# Patient Record
Sex: Male | Born: 1965 | Race: Black or African American | Hispanic: No | Marital: Single | Smoking: Current every day smoker
Health system: Southern US, Community
[De-identification: ages and names within clinical notes are randomized; demographics above are authoritative.]

## PROBLEM LIST (undated history)

## (undated) DIAGNOSIS — Z0000 Encounter for general adult medical examination without abnormal findings: Secondary | ICD-10-CM

---

## 2017-03-21 ENCOUNTER — Encounter (HOSPITAL_BASED_OUTPATIENT_CLINIC_OR_DEPARTMENT_OTHER): Payer: Self-pay

## 2017-03-21 ENCOUNTER — Emergency Department (HOSPITAL_BASED_OUTPATIENT_CLINIC_OR_DEPARTMENT_OTHER): Payer: BLUE CROSS/BLUE SHIELD

## 2017-03-21 ENCOUNTER — Emergency Department (HOSPITAL_BASED_OUTPATIENT_CLINIC_OR_DEPARTMENT_OTHER)
Admission: EM | Admit: 2017-03-21 | Discharge: 2017-03-21 | Disposition: A | Discharge status: Home / Self Care | Payer: BLUE CROSS/BLUE SHIELD | Attending: Emergency Medicine | Admitting: Emergency Medicine

## 2017-03-21 ENCOUNTER — Other Ambulatory Visit: Payer: Self-pay

## 2017-03-21 DIAGNOSIS — R0602 Shortness of breath: Secondary | ICD-10-CM | POA: Diagnosis present

## 2017-03-21 DIAGNOSIS — F17210 Nicotine dependence, cigarettes, uncomplicated: Secondary | ICD-10-CM | POA: Insufficient documentation

## 2017-03-21 DIAGNOSIS — J189 Pneumonia, unspecified organism: Secondary | ICD-10-CM | POA: Diagnosis not present

## 2017-03-21 MED ORDER — IBUPROFEN 800 MG PO TABS
800.0000 mg | ORAL_TABLET | Freq: Once | ORAL | Status: AC
  Administered 2017-03-21: 800 mg via ORAL
  Filled 2017-03-21: qty 1

## 2017-03-21 MED ORDER — AZITHROMYCIN 250 MG PO TABS: ORAL_TABLET | ORAL | 0 refills | Status: DC

## 2017-03-21 MED ORDER — AZITHROMYCIN 250 MG PO TABS
500.0000 mg | ORAL_TABLET | Freq: Once | ORAL | Status: AC
  Administered 2017-03-21: 500 mg via ORAL
  Filled 2017-03-21: qty 2

## 2017-03-21 MED ORDER — AMOXICILLIN-POT CLAVULANATE 875-125 MG PO TABS: 1.0000 | ORAL_TABLET | Freq: Two times a day (BID) | ORAL | 0 refills | Status: DC

## 2017-03-21 MED ORDER — NAPROXEN 500 MG PO TABS: 500.0000 mg | ORAL_TABLET | Freq: Two times a day (BID) | ORAL | 0 refills | Status: DC

## 2017-03-21 MED ORDER — CEFTRIAXONE SODIUM 1 G IJ SOLR
1.0000 g | Freq: Once | INTRAMUSCULAR | Status: AC
  Administered 2017-03-21: 1 g via INTRAMUSCULAR
  Filled 2017-03-21: qty 10

## 2017-03-21 NOTE — ED Provider Notes (Signed)
[facility] EMERGENCY DEPARTMENT Provider Note   CSN: [id number] Arrival date & time: 03/21/17  1825     History   Chief Complaint Chief Complaint  Patient presents with  . Shortness of Breath    HPI Erik [name] is a 51 y.o. male. Chief complaint is fevers 4 days ago, cough.  HPI 51 year old male. Otherwise healthy. States that 4 days ago on Sunday he had shakes and chills. The next day on Monday he had a cough. Now 3 days ago on Thursday has persistent cough and some right-sided pleuritic chest pain. No or fevers. No shortness of breath. States he feels somewhat weak and fatigued. He continues to work without restrictions or difficulties. No GI complaints.  History reviewed. No pertinent past medical history.  There are no active problems to display for this patient.   History reviewed. No pertinent surgical history.     Home Medications    Prior to Admission medications   Medication Sig Start Date End Date Taking? Authorizing Provider  amoxicillin-clavulanate (AUGMENTIN) 875-125 MG tablet Take 1 tablet by mouth 2 (two) times daily. 03/21/17   Rolland PorterJames, Simpson Paulos, MD  azithromycin (ZITHROMAX Z-PAK) 250 MG tablet As directed 03/21/17   Rolland PorterJames, Tanesia Butner, MD  naproxen (NAPROSYN) 500 MG tablet Take 1 tablet (500 mg total) by mouth 2 (two) times daily. 03/21/17   Rolland PorterJames, Cian Costanzo, MD    Family History No family history on file.  Social History Social History   Tobacco Use  . Smoking status: Current Every Day Smoker  . Smokeless tobacco: Never Used  Substance Use Topics  . Alcohol use: Yes    Comment: weekly  . Drug use: No     Allergies   Patient has no known allergies.   Review of Systems Review of Systems  Constitutional: Positive for chills and fever. Negative for appetite change, diaphoresis and fatigue.  HENT: Negative for mouth sores, sore throat and trouble swallowing.   Eyes: Negative for visual disturbance.  Respiratory: Positive for cough. Negative for  chest tightness, shortness of breath and wheezing.   Cardiovascular: Negative for chest pain.  Gastrointestinal: Negative for abdominal distention, abdominal pain, diarrhea, nausea and vomiting.  Endocrine: Negative for polydipsia, polyphagia and polyuria.  Genitourinary: Negative for dysuria, frequency and hematuria.  Musculoskeletal: Negative for gait problem.  Skin: Negative for color change, pallor and rash.  Neurological: Negative for dizziness, syncope, light-headedness and headaches.  Hematological: Does not bruise/bleed easily.  Psychiatric/Behavioral: Negative for behavioral problems and confusion.     Physical Exam Updated Vital Signs BP 128/84 (BP Location: Right Arm)   Pulse 96   Temp 99.9 F (37.7 C) (Oral)   Resp 20   Ht 5\' 6"  (1.676 m)   Wt 73 kg (161 lb)   SpO2 97%   BMI 25.99 kg/m   Physical Exam  Constitutional: He is oriented to person, place, and time. He appears well-developed and well-nourished. No distress.  HENT:  Head: Normocephalic.  Eyes: Conjunctivae are normal. Pupils are equal, round, and reactive to light. No scleral icterus.  Neck: Normal range of motion. Neck supple. No thyromegaly present.  Cardiovascular: Normal rate and regular rhythm. Exam reveals no gallop and no friction rub.  No murmur heard. Pulmonary/Chest: Effort normal and breath sounds normal. No respiratory distress. He has no wheezes. He has no rales.  Crackles right lateral inferior chest. No increased work of breathing. Not tachypneic. Saturating 100%. Temp 99.  Abdominal: Soft. Bowel sounds are normal. He exhibits no distension. There  is no tenderness. There is no rebound.  Musculoskeletal: Normal range of motion.  Neurological: He is alert and oriented to person, place, and time.  Skin: Skin is warm and dry. No rash noted.  Psychiatric: He has a normal mood and affect. His behavior is normal.     ED Treatments / Results  Labs (all labs ordered are listed, but only  abnormal results are displayed) Labs Reviewed - No data to display  EKG  EKG Interpretation None       Radiology Dg Chest 2 View  Result Date: 03/21/2017 CLINICAL DATA:  Cough and shortness of breath EXAM: CHEST  2 VIEW COMPARISON:  None. FINDINGS: There is patchy airspace consolidation in both lung bases, more on the right than on the left. The lungs elsewhere clear. Heart size and pulmonary vascularity are normal. No adenopathy. No evident bone lesions. IMPRESSION: Patchy airspace consolidation in the lung bases, more on the right on the left, felt to represent multifocal pneumonia. Lungs elsewhere clear. Heart size within normal limits. No evident adenopathy. Electronically Signed   By: Bretta BangWilliam  Woodruff III M.D.   On: 03/21/2017 18:59    Procedures Procedures (including critical care time)  Medications Ordered in ED Medications  cefTRIAXone (ROCEPHIN) injection 1 g (1 g Intramuscular Given 03/21/17 1922)  azithromycin (ZITHROMAX) tablet 500 mg (500 mg Oral Given 03/21/17 1920)  ibuprofen (ADVIL,MOTRIN) tablet 800 mg (800 mg Oral Given 03/21/17 1920)     Initial Impression / Assessment and Plan / ED Course  I have reviewed the triage vital signs and the nursing notes.  Pertinent labs & imaging results that were available during my care of the patient were reviewed by me and considered in my medical decision making (see chart for details).     Chest x-ray shows bilateral lower lobe infiltrates. Patchy. Not frankly lobar. With his single Reiger 4 days ago followed by cough suspicion for strep pneumoniae. Bilateral without consider atypical. We'll treat with IM Rocephin tonight. Augmentin and Zithromax. He declines any additional treatment or therapy. He is well-hydrated taking by mouth. Given ibuprofen for his pleuritic discomfort. Recheck with worsening symptoms.  Final Clinical Impressions(s) / ED Diagnoses   Final diagnoses:  Community acquired pneumonia, unspecified  laterality    ED Discharge Orders        Ordered    amoxicillin-clavulanate (AUGMENTIN) 875-125 MG tablet  2 times daily     03/21/17 1925    azithromycin (ZITHROMAX Z-PAK) 250 MG tablet     03/21/17 1925    naproxen (NAPROSYN) 500 MG tablet  2 times daily     03/21/17 1925       Rolland PorterJames, Mayetta Castleman, MD 03/21/17 1931

## 2017-03-21 NOTE — Discharge Instructions (Signed)
Rest, push fluids to stay hydrated. Naproxen for pain. Augmentin, and Zithromax. 2 different antibiotics. Please take the full course of both. Return to ER for recheck with worsening symptoms.

## 2017-03-21 NOTE — ED Notes (Signed)
Pt discharged to home NAD.  

## 2017-03-21 NOTE — ED Triage Notes (Addendum)
C/o SOB and right rib, right shoulder pain x 2 days-prod cough, fever x 4 days-denies injury-NAD-steady gait

## 2018-01-31 HISTORY — PX: POSTERIOR FUSION THORACIC SPINE: SUR1045

## 2018-03-05 ENCOUNTER — Encounter: Payer: Self-pay | Admitting: Occupational Therapy
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
## 2018-03-10 ENCOUNTER — Other Ambulatory Visit (HOSPITAL_COMMUNITY): Payer: Self-pay | Admitting: Physical Medicine and Rehabilitation

## 2018-03-10 ENCOUNTER — Encounter (HOSPITAL_COMMUNITY): Payer: Self-pay

## 2018-03-10 ENCOUNTER — Other Ambulatory Visit: Payer: Self-pay

## 2018-03-10 ENCOUNTER — Inpatient Hospital Stay (HOSPITAL_COMMUNITY)
Admission: RE | Admit: 2018-03-10 | Discharge: 2018-04-11 | DRG: 945 | Disposition: A | Discharge status: Home Health | Payer: BLUE CROSS/BLUE SHIELD | Source: Other Acute Inpatient Hospital | Attending: Physical Medicine & Rehabilitation | Admitting: Physical Medicine & Rehabilitation

## 2018-03-10 DIAGNOSIS — A084 Viral intestinal infection, unspecified: Secondary | ICD-10-CM

## 2018-03-10 DIAGNOSIS — N319 Neuromuscular dysfunction of bladder, unspecified: Secondary | ICD-10-CM

## 2018-03-10 DIAGNOSIS — Z09 Encounter for follow-up examination after completed treatment for conditions other than malignant neoplasm: Secondary | ICD-10-CM

## 2018-03-10 DIAGNOSIS — N390 Urinary tract infection, site not specified: Secondary | ICD-10-CM | POA: Diagnosis not present

## 2018-03-10 DIAGNOSIS — S22089D Unspecified fracture of T11-T12 vertebra, subsequent encounter for fracture with routine healing: Secondary | ICD-10-CM | POA: Diagnosis not present

## 2018-03-10 DIAGNOSIS — S2769XD Other injury of pleura, subsequent encounter: Secondary | ICD-10-CM | POA: Diagnosis not present

## 2018-03-10 DIAGNOSIS — K592 Neurogenic bowel, not elsewhere classified: Secondary | ICD-10-CM

## 2018-03-10 DIAGNOSIS — S24154A Other incomplete lesion at T11-T12 level of thoracic spinal cord, initial encounter: Secondary | ICD-10-CM | POA: Diagnosis present

## 2018-03-10 DIAGNOSIS — K219 Gastro-esophageal reflux disease without esophagitis: Secondary | ICD-10-CM | POA: Diagnosis present

## 2018-03-10 DIAGNOSIS — T380X5A Adverse effect of glucocorticoids and synthetic analogues, initial encounter: Secondary | ICD-10-CM | POA: Diagnosis not present

## 2018-03-10 DIAGNOSIS — S24114D Complete lesion at T11-T12 level of thoracic spinal cord, subsequent encounter: Principal | ICD-10-CM

## 2018-03-10 DIAGNOSIS — K828 Other specified diseases of gallbladder: Secondary | ICD-10-CM | POA: Diagnosis present

## 2018-03-10 DIAGNOSIS — R1310 Dysphagia, unspecified: Secondary | ICD-10-CM

## 2018-03-10 DIAGNOSIS — K5903 Drug induced constipation: Secondary | ICD-10-CM | POA: Diagnosis not present

## 2018-03-10 DIAGNOSIS — M25462 Effusion, left knee: Secondary | ICD-10-CM | POA: Diagnosis not present

## 2018-03-10 DIAGNOSIS — S22079D Unspecified fracture of T9-T10 vertebra, subsequent encounter for fracture with routine healing: Secondary | ICD-10-CM | POA: Diagnosis not present

## 2018-03-10 DIAGNOSIS — S82839A Other fracture of upper and lower end of unspecified fibula, initial encounter for closed fracture: Secondary | ICD-10-CM

## 2018-03-10 DIAGNOSIS — R112 Nausea with vomiting, unspecified: Secondary | ICD-10-CM

## 2018-03-10 DIAGNOSIS — S24104S Unspecified injury at T11-T12 level of thoracic spinal cord, sequela: Secondary | ICD-10-CM

## 2018-03-10 DIAGNOSIS — S24104A Unspecified injury at T11-T12 level of thoracic spinal cord, initial encounter: Secondary | ICD-10-CM

## 2018-03-10 DIAGNOSIS — G8220 Paraplegia, unspecified: Secondary | ICD-10-CM | POA: Diagnosis present

## 2018-03-10 DIAGNOSIS — S22089S Unspecified fracture of T11-T12 vertebra, sequela: Secondary | ICD-10-CM

## 2018-03-10 DIAGNOSIS — F17200 Nicotine dependence, unspecified, uncomplicated: Secondary | ICD-10-CM | POA: Diagnosis present

## 2018-03-10 DIAGNOSIS — Y92239 Unspecified place in hospital as the place of occurrence of the external cause: Secondary | ICD-10-CM | POA: Diagnosis not present

## 2018-03-10 DIAGNOSIS — K224 Dyskinesia of esophagus: Secondary | ICD-10-CM | POA: Diagnosis not present

## 2018-03-10 DIAGNOSIS — S22089A Unspecified fracture of T11-T12 vertebra, initial encounter for closed fracture: Secondary | ICD-10-CM

## 2018-03-10 DIAGNOSIS — I10 Essential (primary) hypertension: Secondary | ICD-10-CM | POA: Diagnosis not present

## 2018-03-10 DIAGNOSIS — B9620 Unspecified Escherichia coli [E. coli] as the cause of diseases classified elsewhere: Secondary | ICD-10-CM | POA: Diagnosis not present

## 2018-03-10 DIAGNOSIS — D709 Neutropenia, unspecified: Secondary | ICD-10-CM | POA: Diagnosis present

## 2018-03-10 DIAGNOSIS — R1319 Other dysphagia: Secondary | ICD-10-CM

## 2018-03-10 DIAGNOSIS — R0602 Shortness of breath: Secondary | ICD-10-CM

## 2018-03-10 DIAGNOSIS — M79661 Pain in right lower leg: Secondary | ICD-10-CM | POA: Diagnosis present

## 2018-03-10 DIAGNOSIS — S2241XD Multiple fractures of ribs, right side, subsequent encounter for fracture with routine healing: Secondary | ICD-10-CM | POA: Diagnosis not present

## 2018-03-10 DIAGNOSIS — D62 Acute posthemorrhagic anemia: Secondary | ICD-10-CM | POA: Diagnosis present

## 2018-03-10 DIAGNOSIS — S82402A Unspecified fracture of shaft of left fibula, initial encounter for closed fracture: Secondary | ICD-10-CM

## 2018-03-10 DIAGNOSIS — M79662 Pain in left lower leg: Secondary | ICD-10-CM | POA: Diagnosis present

## 2018-03-10 DIAGNOSIS — R110 Nausea: Secondary | ICD-10-CM

## 2018-03-10 DIAGNOSIS — I951 Orthostatic hypotension: Secondary | ICD-10-CM

## 2018-03-10 DIAGNOSIS — S27803D Laceration of diaphragm, subsequent encounter: Secondary | ICD-10-CM | POA: Diagnosis not present

## 2018-03-10 DIAGNOSIS — S82402D Unspecified fracture of shaft of left fibula, subsequent encounter for closed fracture with routine healing: Secondary | ICD-10-CM | POA: Diagnosis not present

## 2018-03-10 DIAGNOSIS — E871 Hypo-osmolality and hyponatremia: Secondary | ICD-10-CM | POA: Diagnosis not present

## 2018-03-10 DIAGNOSIS — S24154D Other incomplete lesion at T11-T12 level of thoracic spinal cord, subsequent encounter: Secondary | ICD-10-CM | POA: Diagnosis not present

## 2018-03-10 HISTORY — DX: Encounter for general adult medical examination without abnormal findings: Z00.00

## 2018-03-10 MED ORDER — LIDOCAINE HCL URETHRAL/MUCOSAL 2 % EX GEL
CUTANEOUS | Status: DC | PRN
  Filled 2018-03-10: qty 5

## 2018-03-10 MED ORDER — TRAMADOL HCL 50 MG PO TABS
50.0000 mg | ORAL_TABLET | Freq: Four times a day (QID) | ORAL | Status: DC | PRN
  Administered 2018-03-11 – 2018-04-11 (×35): 50 mg via ORAL
  Filled 2018-03-10 (×36): qty 1

## 2018-03-10 MED ORDER — OXYCODONE HCL 5 MG PO TABS
5.0000 mg | ORAL_TABLET | ORAL | Status: DC | PRN
  Administered 2018-03-10 – 2018-03-17 (×8): 10 mg via ORAL
  Administered 2018-03-18: 5 mg via ORAL
  Administered 2018-03-20: 10 mg via ORAL
  Filled 2018-03-10 (×7): qty 2
  Filled 2018-03-10: qty 1
  Filled 2018-03-10 (×4): qty 2

## 2018-03-10 MED ORDER — PROCHLORPERAZINE 25 MG RE SUPP: 12.5000 mg | Freq: Four times a day (QID) | RECTAL | Status: DC | PRN

## 2018-03-10 MED ORDER — PROCHLORPERAZINE MALEATE 5 MG PO TABS
5.0000 mg | ORAL_TABLET | Freq: Four times a day (QID) | ORAL | Status: DC | PRN
  Administered 2018-03-17: 5 mg via ORAL
  Administered 2018-03-31 – 2018-04-04 (×5): 10 mg via ORAL
  Filled 2018-03-10 (×5): qty 2
  Filled 2018-03-10: qty 1
  Filled 2018-03-10: qty 2

## 2018-03-10 MED ORDER — GABAPENTIN 300 MG PO CAPS
300.0000 mg | ORAL_CAPSULE | Freq: Three times a day (TID) | ORAL | Status: DC
  Administered 2018-03-10 – 2018-03-14 (×13): 300 mg via ORAL
  Filled 2018-03-10 (×7): qty 1
  Filled 2018-03-10: qty 3
  Filled 2018-03-10 (×5): qty 1

## 2018-03-10 MED ORDER — GUAIFENESIN-DM 100-10 MG/5ML PO SYRP: 5.0000 mL | ORAL_SOLUTION | Freq: Four times a day (QID) | ORAL | Status: DC | PRN

## 2018-03-10 MED ORDER — PROCHLORPERAZINE EDISYLATE 10 MG/2ML IJ SOLN: 5.0000 mg | Freq: Four times a day (QID) | INTRAMUSCULAR | Status: DC | PRN

## 2018-03-10 MED ORDER — BISACODYL 10 MG RE SUPP
10.0000 mg | Freq: Every day | RECTAL | Status: DC
  Administered 2018-03-11 – 2018-03-21 (×11): 10 mg via RECTAL
  Filled 2018-03-10 (×11): qty 1

## 2018-03-10 MED ORDER — POLYETHYLENE GLYCOL 3350 17 G PO PACK: 17.0000 g | PACK | Freq: Every day | ORAL | Status: DC | PRN

## 2018-03-10 MED ORDER — ADULT MULTIVITAMIN W/MINERALS CH
1.0000 | ORAL_TABLET | Freq: Every day | ORAL | Status: DC
  Administered 2018-03-11 – 2018-04-02 (×23): 1 via ORAL
  Filled 2018-03-10 (×25): qty 1

## 2018-03-10 MED ORDER — ACETAMINOPHEN 325 MG PO TABS
325.0000 mg | ORAL_TABLET | ORAL | Status: DC | PRN
  Administered 2018-03-25 – 2018-04-08 (×4): 650 mg via ORAL
  Filled 2018-03-10: qty 2
  Filled 2018-03-10: qty 1
  Filled 2018-03-10 (×4): qty 2

## 2018-03-10 MED ORDER — ENOXAPARIN SODIUM 40 MG/0.4ML ~~LOC~~ SOLN: 40.0000 mg | SUBCUTANEOUS | Status: DC

## 2018-03-10 MED ORDER — BISACODYL 10 MG RE SUPP: 10.0000 mg | Freq: Every day | RECTAL | Status: DC | PRN

## 2018-03-10 MED ORDER — TRAZODONE HCL 50 MG PO TABS
25.0000 mg | ORAL_TABLET | Freq: Every evening | ORAL | Status: DC | PRN
  Administered 2018-03-12: 50 mg via ORAL
  Filled 2018-03-10: qty 1

## 2018-03-10 MED ORDER — DIPHENHYDRAMINE HCL 12.5 MG/5ML PO ELIX: 12.5000 mg | ORAL_SOLUTION | Freq: Four times a day (QID) | ORAL | Status: DC | PRN

## 2018-03-10 MED ORDER — PANTOPRAZOLE SODIUM 40 MG PO TBEC
40.0000 mg | DELAYED_RELEASE_TABLET | Freq: Every day | ORAL | Status: DC
  Administered 2018-03-11 – 2018-04-02 (×23): 40 mg via ORAL
  Filled 2018-03-10 (×24): qty 1

## 2018-03-10 MED ORDER — CYCLOBENZAPRINE HCL 5 MG PO TABS
5.0000 mg | ORAL_TABLET | Freq: Three times a day (TID) | ORAL | Status: DC | PRN
  Administered 2018-03-11 – 2018-03-22 (×7): 5 mg via ORAL
  Filled 2018-03-10 (×7): qty 1

## 2018-03-10 MED ORDER — FLEET ENEMA 7-19 GM/118ML RE ENEM: 1.0000 | ENEMA | Freq: Once | RECTAL | Status: DC | PRN

## 2018-03-10 MED ORDER — MELATONIN 3 MG PO TABS
3.0000 mg | ORAL_TABLET | Freq: Every day | ORAL | Status: DC
  Administered 2018-03-10 – 2018-04-03 (×25): 3 mg via ORAL
  Filled 2018-03-10 (×28): qty 1

## 2018-03-10 MED ORDER — ENOXAPARIN SODIUM 30 MG/0.3ML ~~LOC~~ SOLN
30.0000 mg | Freq: Two times a day (BID) | SUBCUTANEOUS | Status: DC
  Administered 2018-03-10 – 2018-04-11 (×64): 30 mg via SUBCUTANEOUS
  Filled 2018-03-10 (×64): qty 0.3

## 2018-03-10 MED ORDER — POLYETHYLENE GLYCOL 3350 17 G PO PACK: 17.0000 g | PACK | Freq: Every day | ORAL | Status: DC

## 2018-03-10 MED ORDER — ALUM & MAG HYDROXIDE-SIMETH 200-200-20 MG/5ML PO SUSP
30.0000 mL | ORAL | Status: DC | PRN
  Administered 2018-03-24 – 2018-04-06 (×11): 30 mL via ORAL
  Filled 2018-03-10 (×11): qty 30

## 2018-03-10 MED ORDER — METHOCARBAMOL 500 MG PO TABS: 500.0000 mg | ORAL_TABLET | Freq: Four times a day (QID) | ORAL | Status: DC | PRN
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
## 2018-03-10 NOTE — Progress Notes (Signed)
Pt arrived via EMS. Pt alert and oriented. Personal belongings left pt.
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
## 2018-03-11 ENCOUNTER — Inpatient Hospital Stay (HOSPITAL_COMMUNITY): Payer: Self-pay | Admitting: Physical Therapy

## 2018-03-11 ENCOUNTER — Ambulatory Visit (HOSPITAL_COMMUNITY): Payer: BLUE CROSS/BLUE SHIELD | Attending: Physical Medicine and Rehabilitation

## 2018-03-11 ENCOUNTER — Inpatient Hospital Stay (HOSPITAL_COMMUNITY): Payer: Self-pay | Admitting: Occupational Therapy

## 2018-03-11 DIAGNOSIS — M79662 Pain in left lower leg: Secondary | ICD-10-CM | POA: Insufficient documentation

## 2018-03-11 DIAGNOSIS — S22089S Unspecified fracture of T11-T12 vertebra, sequela: Secondary | ICD-10-CM

## 2018-03-11 DIAGNOSIS — M7989 Other specified soft tissue disorders: Secondary | ICD-10-CM

## 2018-03-11 DIAGNOSIS — S24104S Unspecified injury at T11-T12 level of thoracic spinal cord, sequela: Secondary | ICD-10-CM

## 2018-03-11 DIAGNOSIS — M79661 Pain in right lower leg: Secondary | ICD-10-CM | POA: Insufficient documentation

## 2018-03-11 LAB — CBC WITH DIFFERENTIAL/PLATELET
Abs Immature Granulocytes: 0.08 10*3/uL — ABNORMAL HIGH (ref 0.00–0.07)
Basophils Absolute: 0.1 10*3/uL (ref 0.0–0.1)
Basophils Relative: 1 %
Eosinophils Absolute: 0.1 10*3/uL (ref 0.0–0.5)
Eosinophils Relative: 1 %
HCT: 34.5 % — ABNORMAL LOW (ref 39.0–52.0)
Hemoglobin: 11 g/dL — ABNORMAL LOW (ref 13.0–17.0)
Immature Granulocytes: 1 %
Lymphocytes Relative: 23 %
Lymphs Abs: 1.7 10*3/uL (ref 0.7–4.0)
MCH: 29.6 pg (ref 26.0–34.0)
MCHC: 31.9 g/dL (ref 30.0–36.0)
MCV: 92.7 fL (ref 80.0–100.0)
Monocytes Absolute: 0.8 10*3/uL (ref 0.1–1.0)
Monocytes Relative: 10 %
Neutro Abs: 4.8 10*3/uL (ref 1.7–7.7)
Neutrophils Relative %: 64 %
Platelets: 445 10*3/uL — ABNORMAL HIGH (ref 150–400)
RBC: 3.72 MIL/uL — ABNORMAL LOW (ref 4.22–5.81)
RDW: 13.2 % (ref 11.5–15.5)
WBC: 7.6 10*3/uL (ref 4.0–10.5)
nRBC: 0 % (ref 0.0–0.2)

## 2018-03-11 LAB — COMPREHENSIVE METABOLIC PANEL
ALK PHOS: 75 U/L (ref 38–126)
ALT: 33 U/L (ref 0–44)
AST: 20 U/L (ref 15–41)
Albumin: 3 g/dL — ABNORMAL LOW (ref 3.5–5.0)
Anion gap: 10 (ref 5–15)
BUN: 18 mg/dL (ref 6–20)
CO2: 29 mmol/L (ref 22–32)
Calcium: 8.8 mg/dL — ABNORMAL LOW (ref 8.9–10.3)
Chloride: 98 mmol/L (ref 98–111)
Creatinine, Ser: 0.98 mg/dL (ref 0.61–1.24)
GFR calc Af Amer: >60 mL/min (ref >60)
GFR calc non Af Amer: >60 mL/min (ref >60)
Glucose, Bld: 111 mg/dL — ABNORMAL HIGH (ref 70–99)
Potassium: 3.6 mmol/L (ref 3.5–5.1)
SODIUM: 137 mmol/L (ref 135–145)
Total Bilirubin: 0.7 mg/dL (ref 0.3–1.2)
Total Protein: 6 g/dL — ABNORMAL LOW (ref 6.5–8.1)

## 2018-03-11 LAB — MRSA PCR SCREENING: MRSA by PCR: NEGATIVE

## 2018-03-11 NOTE — Progress Notes (Signed)
LE venous duplex       has been completed. Preliminary results can be found under CV proc through chart review. Mallisa Alameda Eunice, RDMS, RVT   

## 2018-03-11 NOTE — Progress Notes (Signed)
Physical Therapy Session Note  Patient Details  Name: Erik InchesGlen Lengacher MRN: [id number] Date of Birth: 08-10-1965  Today's Date: 03/11/2018 PT Individual Time: 1300-1400 PT Individual Time Calculation (min): 60 min   Short Term Goals: Week 1:  PT Short Term Goal 1 (Week 1): Pt will perform bed mobility with mod A consistently PT Short Term Goal 2 (Week 1): Pt will maintain sitting balance EOB with SBA consistently PT Short Term Goal 3 (Week 1): Pt will be able to recall pressure relief techniques  Skilled Therapeutic Interventions/Progress Updates:    Pt received seated in w/c in room, agreeable to PT. Set pt up with more narrow w/c for better fit and decreased shoulder strain with w/c mobility. Sliding board transfer w/c to/from bed with max A, multimodal cueing for head/hips relationship and weight shift with transfer. Manual w/c propulsion x 150 ft with BUE and Supervision. Education with patient again about pressure relief techniques, demonstrated several methods of pressure relief while seated in w/c. Provided pt with timer set to 30 min so that he can remember to perform techniques. Orientation to SCI notebook, pt agreeable to read through information during downtime and then will bring questions to therapy. Pt left seated in w/c with needs in reach.  Therapy Documentation Precautions:  Precautions Precautions: Back Precaution Comments: TLSO worn when OOB Required Braces or Orthoses: Spinal Brace Spinal Brace: Thoracolumbosacral orthotic, Applied in sitting position Restrictions Weight Bearing Restrictions: No    Therapy/Group: Individual Therapy  Peter Congoaylor Dalyn Becker, PT, DPT  03/11/2018, 3:42 PM

## 2018-03-11 NOTE — Patient Care Conference (Signed)
Inpatient RehabilitationTeam Conference and Plan of Care Update Date: 03/12/2018   Time: 12:59 PM    Patient Name: Erik [name]      Medical Record Number: [id number]  Date of Birth: 03-03-1966 Sex: Male         Room/Bed: 4W16C/4W16C-01 Payor Info: Payor: BLUE CROSS BLUE [employer]IELD / Plan: BCBS OTHER / Product Type: *No Product type* /    Admitting Diagnosis: Paraplegia  Admit Date/Time:  03/10/2018  1:23 PM Admission Comments: No comment available   Primary Diagnosis:  <principal problem not specified> Principal Problem: <principal problem not specified>  Patient Active Problem List   Diagnosis Date Noted  . Closed fracture of T11 vertebra with spinal cord injury (HCC) 03/10/2018  . Acute blood loss anemia   . Orthostasis   . Neurogenic bladder   . Neurogenic bowel   . Paraplegia [facility](HCC)     Expected Discharge Date: Expected Discharge Date: (4 weeks)  Team Members Present: Physician leading conference: Dr. Faith RogueZachary Darcie Mellone Social Worker Present: Amada JupiterLucy Hoyle, LCSW Nurse Present: Chana Bodeeborah Sharp, RN PT Present: Other (comment)(Taylor Serita Griturkalo, PT) OT Present: Jackquline DenmarkKatie Bradsher, OT SLP Present: Jackalyn LombardNicole Page, SLP PPS Coordinator present : Tora DuckMarie Noel, RN, CRRN;Melissa Bowie     Current Status/Progress Goal Weekly Team Focus  Medical   T10/11 SCI with complete paraplegia, neurogenic bowel and bladder. pain controlled,   improve activity tolerance  bowel and bladder program, optimize bp, SCI education   Bowel/Bladder   Incontinent of bowel and bladder LBM 03/10/2018  Pt will have less episodes of incontinence.  Q2h toileting, asssess bowel and bladder neweds qshift and PRN   Swallow/Nutrition/ Hydration             ADL's   min A UB self care, max A LB self care, max A slide board transfer  mod I overall from wheelchair level with use of slide board for functional transfers  functional transfers, ADL retraining, balance, pt/family education   Mobility   min A rolling, max A supine to/from  sit, max A slide board transfer, S w/c mobility  Mod I with all transfers and w/c mobility  bed mobility, transfers, sitting balance, SCI education   Communication             Safety/Cognition/ Behavioral Observations            Pain    C/o pain to back and right ribs, reports highest pain a 8 on scheduled oxycodone and tramadol   Pain 2 or less  assess pain q shit at prn   Skin   surgical site to back, closed with glue, wound edges well approximated. No drainage or s/s infection to site  Promate proper wound healing. Wound will remain free of infection.   Assess skin qshift and PRN     Rehab Goals Patient on target to meet rehab goals: Yes *See Care Plan and progress notes for long and short-term goals.     Barriers to Discharge  Current Status/Progress Possible Resolutions Date Resolved   Physician    Neurogenic Bowel & Bladder;Medical stability        see medical progress notes      Nursing  Neurogenic Bowel & Bladder               PT  Inaccessible home environment;Decreased caregiver support;Medical stability;Home environment access/layout;Incontinence;Neurogenic Bowel & Bladder                 OT Other (comments)  none known at this time  SLP                SW                Discharge Planning/Teaching Needs:  Home with girlfriend and daughter as primary supports.  May not be able to provide 24/7 support - plans still TBD.  Will schedule education closer to d/c.   Team Discussion:  Reviewed pt's deficits and potential home needs and follow up at discharge  Revisions to Treatment Plan:  None at present    Continued Need for Acute Rehabilitation Level of Care: The patient requires daily medical management by a physician with specialized training in physical medicine and rehabilitation for the following conditions: Daily direction of a multidisciplinary physical rehabilitation program to ensure safe treatment while eliciting the highest outcome that is of  practical value to the patient.: Yes Daily medical management of patient stability for increased activity during participation in an intensive rehabilitation regime.: Yes Daily analysis of laboratory values and/or radiology reports with any subsequent need for medication adjustment of medical intervention for : Post surgical problems;Blood pressure problems;Neurological problems;Urological problems   I attest that I was present, lead the team conference, and concur with the assessment and plan of the team.   Ranelle Oyster 03/12/2018, 12:59 PM

## 2018-03-11 NOTE — Plan of Care (Signed)
  Problem: Consults Goal: RH SPINAL CORD INJURY PATIENT EDUCATION Description  See Patient Education module for education specifics.  Outcome: Progressing   Problem: SCI BOWEL ELIMINATION Goal: RH STG MANAGE BOWEL WITH ASSISTANCE Description STG Manage Bowel with  Min Assistance.  Outcome: Progressing Goal: RH STG SCI MANAGE BOWEL WITH MEDICATION WITH ASSISTANCE Description STG SCI Manage bowel with medication with Min assistance.  Outcome: Progressing Goal: RH STG MANAGE BOWEL W/EQUIPMENT W/ASSISTANCE Description STG Manage Bowel With Equipment With Min Assistance  Outcome: Progressing Goal: RH STG SCI MANAGE BOWEL PROGRAM W/ASSIST OR AS APPROPRIATE Description STG SCI Manage bowel program w/min assist or as appropriate.  Outcome: Progressing   Problem: SCI BLADDER ELIMINATION Goal: RH STG MANAGE BLADDER WITH ASSISTANCE Description STG Manage Bladder With Min Assistance  Outcome: Progressing Goal: RH STG MANAGE BLADDER WITH EQUIPMENT WITH ASSISTANCE Description STG Manage Bladder With Equipment With min  Assistance  Outcome: Progressing Goal: RH STG SCI MANAGE BLADDER PROGRAM W/ASSISTANCE Description Pt will be Mod I with equipment and be able to manage bladder with I/O  cath   Outcome: Progressing   Problem: RH SKIN INTEGRITY Goal: RH STG SKIN FREE OF INFECTION/BREAKDOWN Description Pt will remain free from skin breakdown while in rehab with min assist  Outcome: Progressing Goal: RH STG MAINTAIN SKIN INTEGRITY WITH ASSISTANCE Description STG Maintain Skin Integrity With min Assistance.  Outcome: Progressing   Problem: RH SAFETY Goal: RH STG ADHERE TO SAFETY PRECAUTIONS W/ASSISTANCE/DEVICE Description STG Adhere to Safety Precautions With min Assistance/Device.  Outcome: Progressing   Problem: RH PAIN MANAGEMENT Goal: RH STG PAIN MANAGED AT OR BELOW PT'S PAIN GOAL Description Pain will be managed at a goal set of less than 3 out 10 with PRN pain med with mod  I assist  Outcome: Progressing   Problem: RH KNOWLEDGE DEFICIT SCI Goal: RH STG INCREASE KNOWLEDGE OF SELF CARE AFTER SCI Description Pt will be able to verbalize self care with Mod I assist  Outcome: Progressing   
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
## 2018-03-11 NOTE — Progress Notes (Addendum)
[facility] PHYSICAL MEDICINE & REHABILITATION PROGRESS NOTE   Subjective/Complaints: Had a reasonable night. Pain controlled. Ready for therapy today.   ROS: Patient denies fever, rash, sore throat, blurred vision, nausea, vomiting, diarrhea, cough, shortness of breath or chest pain,   headache, or mood change.    Objective:   No results found. Recent Labs    03/11/18 0631  WBC 7.6  HGB 11.0*  HCT 34.5*  PLT 445*   Recent Labs    03/11/18 0631  NA 137  K 3.6  CL 98  CO2 29  GLUCOSE 111*  BUN 18  CREATININE 0.98  CALCIUM 8.8*    Intake/Output Summary (Last 24 hours) at 03/11/2018 0959 Last data filed at 03/11/2018 0941 Gross per 24 hour  Intake 222 ml  Output 1300 ml  Net -1078 ml     Physical Exam: Vital Signs Blood pressure 121/86, pulse 86, temperature 98.3 F (36.8 C), temperature source Oral, resp. rate 15, SpO2 100 %. Constitutional: No distress . Vital signs reviewed. HEENT: EOMI, oral membranes moist Neck: supple Cardiovascular: RRR without murmur. No JVD    Respiratory: CTA Bilaterally without wheezes or rales. Normal effort    GI: BS +, non-tender, non-distended  Genitourinary: Penis normal.  Musculoskeletal: He exhibits edema. He exhibits no tenderness.  No edema or tenderness in extremities  Neurological: He is alert and oriented to person, place, and time.  Motor: Bilateral upper extremities: 5/5 proximal to distal Bilateral lower extremities: 0/5 proximal distal Sensory diminished below T10, some residual at level of injury, 0/2 below Skin: Skin is warm and dry. He is not diaphoretic.  Mid back incision remains C/D/I with layer of skin glue as dressing?  Psychiatric: pleasant and cooperative     Assessment/Plan: 1. Functional deficits secondary to thoracic spinal cord injury which require 3+ hours per day of interdisciplinary therapy in a comprehensive inpatient rehab setting.  Physiatrist is providing close team supervision and 24 hour  management of active medical problems listed below.  Physiatrist and rehab team continue to assess barriers to discharge/monitor patient progress toward functional and medical goals  Care Tool:  Bathing              Bathing assist       Upper Body Dressing/Undressing Upper body dressing        Upper body assist      Lower Body Dressing/Undressing Lower body dressing            Lower body assist       Toileting Toileting    Toileting assist       Transfers Chair/bed transfer  Transfers assist           Locomotion Ambulation   Ambulation assist              Walk 10 feet activity   Assist           Walk 50 feet activity   Assist           Walk 150 feet activity   Assist           Walk 10 feet on uneven surface  activity   Assist           Wheelchair     Assist               Wheelchair 50 feet with 2 turns activity    Assist            Wheelchair 150 feet activity  Assist           Medical Problem List and Plan: 1.  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  DVT Prophylaxis/Anticoagulation: Pharmaceutical: Lovenox bid x 3 months.   -dopplers today 3. Pain Management: oxycodone prn.  4. Mood: continue to provide positive reinforcement  5. Neuropsych: This patient is capable of making decisions on his own behalf. 6. Skin/Wound Care: routine pressure relief measures.  7. Fluids/Electrolytes/Nutrition: encourage PO  -I personally reviewed the patient's labs today.    8. ABLA:  F/u hgb 11 9. Neurogenic bladder: I/O cath every 4-6 hours to keep volumes less than 350 cc.  follow volumes and patterns.  10 Neurogenic bowel: off Miralax and colace for loose stool  -begin daily suppository    11. Orthostatic symptoms?: Follow BP.  ACE/TEDs.      abdominal binder if needed.     LOS: 1 days A FACE TO FACE EVALUATION WAS PERFORMED  Ranelle Oyster 03/11/2018, 9:59 AM

## 2018-03-11 NOTE — Progress Notes (Signed)
Inpatient Rehabilitation  Patient information reviewed and entered into eRehab system by Tabor Denham M. Adelaida Reindel, M.A., CCC/SLP, PPS Coordinator.  Information including medical coding, functional ability and quality indicators will be reviewed and updated through discharge.    

## 2018-03-12 ENCOUNTER — Inpatient Hospital Stay (HOSPITAL_COMMUNITY): Payer: Self-pay | Admitting: Occupational Therapy

## 2018-03-12 ENCOUNTER — Inpatient Hospital Stay (HOSPITAL_COMMUNITY): Payer: Self-pay | Admitting: Physical Therapy

## 2018-03-12 MED ORDER — SENNOSIDES-DOCUSATE SODIUM 8.6-50 MG PO TABS
2.0000 | ORAL_TABLET | Freq: Every day | ORAL | Status: DC
  Administered 2018-03-12: 2 via ORAL
  Filled 2018-03-12: qty 2
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
## 2018-03-12 NOTE — Progress Notes (Signed)
[facility] PHYSICAL MEDICINE & REHABILITATION PROGRESS NOTE   Subjective/Complaints: Had a reasonable night. Pain controlled. Ready for therapy today.   ROS: Patient denies fever, rash, sore throat, blurred vision, nausea, vomiting, diarrhea, cough, shortness of breath or chest pain,   headache, or mood change.    Objective:   [place] Lower Extremity Venous (dvt)  Result Date: 03/11/2018  Lower Venous Study Indications: Swelling.  Performing Technologist: Farrel DemarkJill Eunice RDMS, RVT  Examination Guidelines: A complete evaluation includes B-mode imaging, spectral Doppler, color Doppler, and power Doppler as needed of all accessible portions of each vessel. Bilateral testing is considered an integral part of a complete examination. Limited examinations for reoccurring indications may be performed as noted.  Right Venous Findings: +---------+---------------+---------+-----------+----------+-------+          CompressibilityPhasicitySpontaneityPropertiesSummary +---------+---------------+---------+-----------+----------+-------+ CFV      Full           Yes      Yes                          +---------+---------------+---------+-----------+----------+-------+ SFJ      Full                                                 +---------+---------------+---------+-----------+----------+-------+ FV Prox  Full                                                 +---------+---------------+---------+-----------+----------+-------+ FV Mid   Full                                                 +---------+---------------+---------+-----------+----------+-------+ FV DistalFull                                                 +---------+---------------+---------+-----------+----------+-------+ PFV      Full                                                 +---------+---------------+---------+-----------+----------+-------+ POP      Full           Yes      Yes                           +---------+---------------+---------+-----------+----------+-------+ PTV      Full                                                 +---------+---------------+---------+-----------+----------+-------+ PERO     Full                                                 +---------+---------------+---------+-----------+----------+-------+  Left Venous Findings: +---------+---------------+---------+-----------+----------+-------+          CompressibilityPhasicitySpontaneityPropertiesSummary +---------+---------------+---------+-----------+----------+-------+ CFV      Full           Yes      Yes                          +---------+---------------+---------+-----------+----------+-------+ SFJ      Full                                                 +---------+---------------+---------+-----------+----------+-------+ FV Prox  Full                                                 +---------+---------------+---------+-----------+----------+-------+ FV Mid   Full                                                 +---------+---------------+---------+-----------+----------+-------+ FV DistalFull                                                 +---------+---------------+---------+-----------+----------+-------+ PFV      Full                                                 +---------+---------------+---------+-----------+----------+-------+ POP      Full           Yes      Yes                          +---------+---------------+---------+-----------+----------+-------+ PTV      Full                                                 +---------+---------------+---------+-----------+----------+-------+ PERO     Full                                                 +---------+---------------+---------+-----------+----------+-------+    Summary: Right: There is no evidence of deep vein thrombosis in the lower extremity. No cystic structure found in the popliteal  fossa. Left: There is no evidence of deep vein thrombosis in the lower extremity. No cystic structure found in the popliteal fossa.  *See table(s) above for measurements and observations. Electronically signed by Waverly Ferrari MD on 03/11/2018 at 5:11:39 PM.    Final    Recent Labs    03/11/18 0631  WBC 7.6  HGB 11.0*  HCT 34.5*  PLT 445*   Recent Labs    03/11/18 0631  NA 137  K 3.6  CL 98  CO2 29  GLUCOSE 111*  BUN 18  CREATININE 0.98  CALCIUM 8.8*    Intake/Output Summary (Last 24 hours) at 03/12/2018 1239 Last data filed at 03/12/2018 0930 Gross per 24 hour  Intake 960 ml  Output 750 ml  Net 210 ml     Physical Exam: Vital Signs Blood pressure 122/84, pulse 81, temperature 98.3 F (36.8 C), resp. rate 17, SpO2 97 %. Constitutional: No distress . Vital signs reviewed. HEENT: EOMI, oral membranes moist Neck: supple Cardiovascular: RRR without murmur. No JVD    Respiratory: CTA Bilaterally without wheezes or rales. Normal effort    GI: BS +, non-tender, non-distended  Musculoskeletal: He exhibits edema. He exhibits no tenderness.  No edema or tenderness in extremities  Neurological: He is alert and oriented to person, place, and time.  Motor: Bilateral upper extremities: 5/5 proximal to distal Bilateral lower extremities: 0/5 proximal distal Sensory diminished below T10, some residual at level of injury, 0/2 below--no change Skin: Skin is warm and dry. He is not diaphoretic.  Back incision CDI with glue Psychiatric: pleasant and cooperative     Assessment/Plan: 1. Functional deficits secondary to thoracic spinal cord injury which require 3+ hours per day of interdisciplinary therapy in a comprehensive inpatient rehab setting.  Physiatrist is providing close team supervision and 24 hour management of active medical problems listed below.  Physiatrist and rehab team continue to assess barriers to discharge/monitor patient progress toward functional and  medical goals  Care Tool:  Bathing  Bathing activity did not occur: Refused Body parts bathed by patient: Right arm, Left arm, Chest, Abdomen, Right upper leg, Left upper leg, Right lower leg, Left lower leg, Face     Body parts n/a: Front perineal area, Buttocks   Bathing assist Assist Level: Minimal Assistance - Patient > 75%     Upper Body Dressing/Undressing Upper body dressing   What is the patient wearing?: Pull over shirt, Orthosis Orthosis activity level: Performed by helper  Upper body assist Assist Level: Minimal Assistance - Patient > 75%    Lower Body Dressing/Undressing Lower body dressing      What is the patient wearing?: Pants     Lower body assist Assist for lower body dressing: Minimal Assistance - Patient > 75%     Toileting Toileting Toileting Activity did not occur (Clothing management and hygiene only): N/A (no void or bm)  Toileting assist Assist for toileting: Dependent - Patient 0%(cath)     Transfers Chair/bed transfer  Transfers assist     Chair/bed transfer assist level: Moderate Assistance - Patient 50 - 74%     Locomotion Ambulation   Ambulation assist   Ambulation activity did not occur: Safety/medical concerns          Walk 10 feet activity   Assist  Walk 10 feet activity did not occur: Safety/medical concerns        Walk 50 feet activity   Assist Walk 50 feet with 2 turns activity did not occur: Safety/medical concerns         Walk 150 feet activity   Assist Walk 150 feet activity did not occur: Safety/medical concerns         Walk 10 feet on uneven surface  activity   Assist Walk 10 feet on uneven surfaces activity did not occur: Safety/medical concerns         Wheelchair     Assist Will patient use wheelchair at discharge?: Yes Type of Wheelchair:  Manual    Wheelchair assist level: Supervision/Verbal cueing Max wheelchair distance: 150'    Wheelchair 50 feet with 2 turns  activity    Assist        Assist Level: Supervision/Verbal cueing   Wheelchair 150 feet activity     Assist     Assist Level: Supervision/Verbal cueing     Medical Problem List and Plan: 1.  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  DVT Prophylaxis/Anticoagulation: Pharmaceutical: Lovenox bid x 3 months.   -dopplers were negative  3. Pain Management: oxycodone prn.  4. Mood: continue to provide positive reinforcement  5. Neuropsych: This patient is capable of making decisions on his own behalf. 6. Skin/Wound Care: routine pressure relief measures.  7. Fluids/Electrolytes/Nutrition: encourage PO    8. ABLA:  F/u hgb 11 9. Neurogenic bladder: I/O cath every 4-6 hours to keep volumes less than 350 cc.  follow volumes and patterns.   -urine with odor this morning---observe 10 Neurogenic bowel: off Miralax and colace for loose stool  -continue daily suppository    -begin senna-s tonight  11. Orthostatic symptoms?: orthostasis minimal  -continue  ACE/TEDs.     abdominal binder if needed.     LOS: 2 days A FACE TO FACE EVALUATION WAS PERFORMED  Ranelle Oyster 03/12/2018, 12:39 PM

## 2018-03-12 NOTE — Patient Care Conference (Signed)
Inpatient RehabilitationTeam Conference and Plan of Care Update Date: 03/11/2018   Time: 2:30 PM    Patient Name: Erik [name]      Medical Record Number: [id number]  Date of Birth: 10/11/1965 Sex: Male         Room/Bed: 4W16C/4W16C-01 Payor Info: Payor: BLUE CROSS BLUE SHIELD / Plan: BCBS OTHER / Product Type: *No Product type* /    Admitting Diagnosis: Paraplegia  Admit Date/Time:  03/10/2018  1:23 PM Admission Comments: No comment available   Primary Diagnosis:  <principal problem not specified> Principal Problem: <principal problem not specified>  Patient Active Problem List   Diagnosis Date Noted  . Closed fracture of T11 vertebra with spinal cord injury (HCC) 03/10/2018  . Acute blood loss anemia   . Orthostasis   . Neurogenic bladder   . Neurogenic bowel   . Paraplegia [facility])     Expected Discharge Date: Expected Discharge Date: (4 weeks)  Team Members Present: Physician leading conference: Dr. Faith Rogue Social Worker Present: Amada Jupiter, LCSW Nurse Present: Chana Bode, RN PT Present: Other (comment)(Taylor Serita Grit, PT) OT Present: Jackquline Denmark, OT SLP Present: Jackalyn Lombard, SLP PPS Coordinator present : Tora Duck, RN, CRRN;Melissa Bowie     Current Status/Progress Goal Weekly Team Focus  Medical   T10/11 SCI with complete paraplegia, neurogenic bowel and bladder. pain controlled,   improve activity tolerance  bowel and bladder program, optimize bp, SCI education   Bowel/Bladder   Incontinent of bowel and bladder LBM 03/10/2018  Pt will have less episodes of incontinence.  Q2h toileting, asssess bowel and bladder neweds qshift and PRN   Swallow/Nutrition/ Hydration             ADL's   min A UB self care, max A LB self care, max A slide board transfer  mod I overall from wheelchair level with use of slide board for functional transfers  functional transfers, ADL retraining, balance, pt/family education   Mobility   min A rolling, max A supine to/from  sit, max A slide board transfer, S w/c mobility  Mod I with all transfers and w/c mobility  bed mobility, transfers, sitting balance, SCI education   Communication             Safety/Cognition/ Behavioral Observations            Pain    C/o pain to back and right ribs, reports highest pain a 8 on scheduled oxycodone and tramadol   Pain 2 or less  assess pain q shit at prn   Skin   surgical site to back, closed with glue, wound edges well approximated. No drainage or s/s infection to site  Promate proper wound healing. Wound will remain free of infection.   Assess skin qshift and PRN     Rehab Goals Patient on target to meet rehab goals: Yes *See Care Plan and progress notes for long and short-term goals.     Barriers to Discharge  Current Status/Progress Possible Resolutions Date Resolved   Physician    Neurogenic Bowel & Bladder;Medical stability        see medical progress notes      Nursing  Neurogenic Bowel & Bladder               PT  Inaccessible home environment;Decreased caregiver support;Medical stability;Home environment access/layout;Incontinence;Neurogenic Bowel & Bladder                 OT Other (comments)  none known at this time  SLP                SW                Discharge Planning/Teaching Needs:  Home with girlfriend and daughter as primary supports.  May not be able to provide 24/7 support - plans still TBD.  Will schedule education closer to d/c.   Team Discussion:  New eval;  Complete SCI.  Sliding board tfs with max assist on eval.  BP remains fairly stable.  Motivated and receptive to txs.  Aiming toward mod ind w/c level goals overall.  Anticipate need for I/O caths at d/c.  Revisions to Treatment Plan:  NA    Continued Need for Acute Rehabilitation Level of Care: The patient requires daily medical management by a physician with specialized training in physical medicine and rehabilitation for the following conditions: Daily direction of  a multidisciplinary physical rehabilitation program to ensure safe treatment while eliciting the highest outcome that is of practical value to the patient.: Yes Daily medical management of patient stability for increased activity during participation in an intensive rehabilitation regime.: Yes Daily analysis of laboratory values and/or radiology reports with any subsequent need for medication adjustment of medical intervention for : Post surgical problems;Blood pressure problems;Neurological problems;Urological problems   I attest that I was present, lead the team conference, and concur with the assessment and plan of the team.   Davelyn Gwinn 03/12/2018, 1:51 PM

## 2018-03-12 NOTE — Progress Notes (Signed)
Social Work Patient ID: Erik [name], male   DOB: Sep 18, 1965, 52 y.o.   MRN: [id number]  Have reviewed team conference with pt and daughter, [name].  Both aware of ELOS of 4 weeks and mod ind w/c level goals.  Pt very motivated and daughter very encouraging.  Continue to follow.  Shabrea Weldin, LCSW

## 2018-03-12 NOTE — Progress Notes (Signed)
Occupational Therapy Session Note  Patient Details  Name: Erik [name] MRN: [id number] Date of Birth: 1965/06/29  Today's Date: 03/12/2018 OT Individual Time: 6962-95280705-0815 OT Individual Time Calculation (min): 70 min    Short Term Goals: Week 1:  OT Short Term Goal 1 (Week 1): Pt will perform LB dressing with mod A for sitting balance with task.  OT Short Term Goal 2 (Week 1): Pt will perform mod A transfer onto commode/ drop arm commode chair.  OT Short Term Goal 3 (Week 1): Pt will perform bathing with mod A overall to decreased A with self care.  Skilled Therapeutic Interventions/Progress Updates:    Upon entering the room, pt supine in bed with no c/o pain this session. Pt agreeable to OT intervention this session. OT utilized bed functions to prop pt into long sitting focus on pt managing LEs in bed for circle sitting to wash and thread clothing. Pt able to complete task with encouragement and min cuing for technique. Pt returning to supine to pull pants over B hips with supervision. Log roll to R side with min A and max A for supine >sit with pt focusing on pushing through elbow and then onto hand to assist. Total A to don TLSO. Slide board transfer with mod A this session with pt pushing through fists of hands for leverage. Pt propelled self to sink for grooming and UB dressing with set up A and assistance to don TLSO again after washing. Pt remained in wheelchair with chair alarm donned for safety.Call bell and all needed items within reach. Breakfast tray placed in front of pt.   Therapy Documentation Precautions:  Precautions Precautions: Back Precaution Comments: TLSO worn when OOB Required Braces or Orthoses: Spinal Brace Spinal Brace: Thoracolumbosacral orthotic, Applied in sitting position Restrictions Weight Bearing Restrictions: No General:   Vital Signs: Therapy Vitals Temp: 98.3 F (36.8 C) Pulse Rate: 81 BP: 122/84 Patient Position (if appropriate): Sitting Oxygen  Therapy SpO2: 97 % O2 Device: Room Air Pain: Pain Assessment Pain Scale: 0-10 Pain Score: 4  Faces Pain Scale: Hurts little more Pain Type: Surgical pain Pain Location: Back Pain Orientation: Mid Pain Descriptors / Indicators: Aching Pain Onset: With Activity Patients Stated Pain Goal: 0 Pain Intervention(s): Medication (See eMAR);Distraction   Therapy/Group: Individual Therapy  Alen BleacherBradsher, [name] P 03/12/2018, 8:53 AM

## 2018-03-12 NOTE — Progress Notes (Signed)
Occupational Therapy Session Note  Patient Details  Name: Erik [name] MRN: [id number] Date of Birth: [birth year]  Today's Date: 03/12/2018 OT Individual Time: [id number] OT Individual Time Calculation (min): 60 min    Short Term Goals: Week 1:  OT Short Term Goal 1 (Week 1): Pt will perform LB dressing with mod A for sitting balance with task.  OT Short Term Goal 2 (Week 1): Pt will perform mod A transfer onto commode/ drop arm commode chair.  OT Short Term Goal 3 (Week 1): Pt will perform bathing with mod A overall to decreased A with self care.  Skilled Therapeutic Interventions/Progress Updates:    Patient in w/c ready for therapy session.  He is pleasant and cooperative, demonstrates good awareness of needs and limitations. Patient able to propel w/c to and from therapy gym.  Requires mod A and cues to position chair, manage leg rests/arm rest in preparation for transfer.  Max A/dependent to place SB.   SB transfer to/from mat surface with min A and cues for technique.  Patient fearful of LOB - focus of tx session on finding center of gravity, adjusting with motion of UEs, unilateral support activity, reaching and moving objects with one hand at a time and eventually lifting bilateral UEs.  Patient demonstrates increased awareness of base of support in SSP.  Completed push up block activity with CG, completed transitional movements from elbows to/from extended arms with CG/minA.   Reviewed skin/LE safety, weight shifts, positioning, UB stretches - with good understanding demonstrated Patient returned to room with family member present.    Therapy Documentation Precautions:  Precautions Precautions: Back Precaution Comments: TLSO worn when OOB Required Braces or Orthoses: Spinal Brace Spinal Brace: Thoracolumbosacral orthotic, Applied in sitting position Restrictions Weight Bearing Restrictions: No General:   Vital Signs:   Pain: Pain Assessment Pain Scale: 0-10 Pain Score: 0-No  pain Faces Pain Scale: Hurts little more Pain Type: Surgical pain Pain Location: Back Pain Orientation: Mid Pain Descriptors / Indicators: Aching Pain Onset: With Activity Patients Stated Pain Goal: 0 Pain Intervention(s): Medication (See eMAR);Distraction     Therapy/Group: Individual Therapy  Barrie LymeStacey A Elda Dunkerson 03/12/2018, 11:28 AM

## 2018-03-12 NOTE — Plan of Care (Signed)
  Problem: Consults Goal: RH SPINAL CORD INJURY PATIENT EDUCATION Description  See Patient Education module for education specifics.  Outcome: Progressing   Problem: SCI BOWEL ELIMINATION Goal: RH STG MANAGE BOWEL WITH ASSISTANCE Description STG Manage Bowel with  Min Assistance.  Outcome: Progressing Goal: RH STG SCI MANAGE BOWEL WITH MEDICATION WITH ASSISTANCE Description STG SCI Manage bowel with medication with Min assistance.  Outcome: Progressing Goal: RH STG MANAGE BOWEL W/EQUIPMENT W/ASSISTANCE Description STG Manage Bowel With Equipment With Min Assistance  Outcome: Progressing Goal: RH STG SCI MANAGE BOWEL PROGRAM W/ASSIST OR AS APPROPRIATE Description STG SCI Manage bowel program w/min assist or as appropriate.  Outcome: Progressing   Problem: SCI BLADDER ELIMINATION Goal: RH STG MANAGE BLADDER WITH ASSISTANCE Description STG Manage Bladder With Min Assistance  Outcome: Progressing Goal: RH STG MANAGE BLADDER WITH EQUIPMENT WITH ASSISTANCE Description STG Manage Bladder With Equipment With min  Assistance  Outcome: Progressing Goal: RH STG SCI MANAGE BLADDER PROGRAM W/ASSISTANCE Description Pt will be Mod I with equipment and be able to manage bladder with I/O  cath   Outcome: Progressing   Problem: RH SKIN INTEGRITY Goal: RH STG SKIN FREE OF INFECTION/BREAKDOWN Description Pt will remain free from skin breakdown while in rehab with min assist  Outcome: Progressing Goal: RH STG MAINTAIN SKIN INTEGRITY WITH ASSISTANCE Description STG Maintain Skin Integrity With min Assistance.  Outcome: Progressing   Problem: RH SAFETY Goal: RH STG ADHERE TO SAFETY PRECAUTIONS W/ASSISTANCE/DEVICE Description STG Adhere to Safety Precautions With min Assistance/Device.  Outcome: Progressing   Problem: RH PAIN MANAGEMENT Goal: RH STG PAIN MANAGED AT OR BELOW PT'S PAIN GOAL Description Pain will be managed at a goal set of less than 3 out 10 with PRN pain med with mod  I assist  Outcome: Progressing   Problem: RH KNOWLEDGE DEFICIT SCI Goal: RH STG INCREASE KNOWLEDGE OF SELF CARE AFTER SCI Description Pt will be able to verbalize self care with Mod I assist  Outcome: Progressing   

## 2018-03-12 NOTE — Progress Notes (Signed)
Physical Therapy Session Note  Patient Details  Name: Erik [name] MRN: [id number] Date of Birth: 18-Jul-1965  Today's Date: 03/12/2018 PT Individual Time: 1400-1500 PT Individual Time Calculation (min): 60 min   Short Term Goals: Week 1:  PT Short Term Goal 1 (Week 1): Pt will perform bed mobility with mod A consistently PT Short Term Goal 2 (Week 1): Pt will maintain sitting balance EOB with SBA consistently PT Short Term Goal 3 (Week 1): Pt will be able to recall pressure relief techniques  Skilled Therapeutic Interventions/Progress Updates:    Pt received seated in bed, agreeable to PT. Pt reports 4/10 pain in back at incision site, receives pain medication from RN during therapy session. Pt reports ongoing loose stools with any straining this date and declines any OOB mobility due to fear of having an incontinent bowel episode. Supine to sit with max A for trunk control and some assist for LE management. Sitting balance EOB with SBA with use of BUE for support. Progression to one UE support and no UE support with close SBA to min A to maintain sitting balance. Progression to holding ball with BUE with CGA to min A for sitting balance. Sit to supine mod A for BLE management. Supine to long-sitting with HOB elevated. Use of BUE on bedrails to pull into long-sitting position. Progression from BUE support on bedrails to one UE support to no UE support in long-sitting position, close SBA to CGA for balance. Pt tolerates sitting in long-sitting position 3 x 5 min. Pt has increase in mid-back pain in position that decreases at rest. Supine BLE PROM in all available planes of motion for contracture prevention. Education with patient and his daughter about importance of core strengthening and BLE ROM. Will provide pt with home stretching program. Pt left seated in bed with needs in reach, bed alarm in place, daughter present.  Therapy Documentation Precautions:  Precautions Precautions:  Back Precaution Comments: TLSO worn when OOB Required Braces or Orthoses: Spinal Brace Spinal Brace: Thoracolumbosacral orthotic, Applied in sitting position Restrictions Weight Bearing Restrictions: No Pain: Pain Assessment Pain Scale: 0-10 Pain Score: 4  Pain Type: Acute pain Pain Location: Back Pain Orientation: Mid Pain Descriptors / Indicators: Aching Pain Frequency: Intermittent Pain Onset: With Activity Patients Stated Pain Goal: 3 Pain Intervention(s): Medication (See eMAR)    Therapy/Group: Individual Therapy  Peter Congoaylor Rumaysa Sabatino, PT, DPT  03/12/2018, 3:56 PM

## 2018-03-12 NOTE — Progress Notes (Signed)
Orthopedic Tech Progress Note Patient Details:  Phineas InchesGlen Griffith [birth year] [id number] Called order in to [facility] Patient ID: Phineas InchesGlen Svec, male   DOB: [birth year], 52 y.o.   MRN: [id number]   Trinna PostMartinez, Zerick Prevette J 03/12/2018, 9:35 AM

## 2018-03-13 ENCOUNTER — Inpatient Hospital Stay (HOSPITAL_COMMUNITY): Payer: BLUE CROSS/BLUE SHIELD | Admitting: Occupational Therapy

## 2018-03-13 ENCOUNTER — Inpatient Hospital Stay (HOSPITAL_COMMUNITY): Payer: BLUE CROSS/BLUE SHIELD

## 2018-03-13 NOTE — Progress Notes (Signed)
PHYSICAL MEDICINE & REHABILITATION PROGRESS NOTE   Subjective/Complaints: Up with therapy. No new issues. Working on transfers.   ROS: Patient denies fever, rash, sore throat, blurred vision, nausea, vomiting, diarrhea, cough, shortness of breath or chest pain, joint or back pain, headache, or mood change.    Objective:   [place] Lower Extremity Venous (dvt)  Result Date: 03/11/2018  Lower Venous Study Indications: Swelling.  Performing Technologist: Farrel DemarkJill Eunice RDMS, RVT  Examination Guidelines: A complete evaluation includes B-mode imaging, spectral Doppler, color Doppler, and power Doppler as needed of all accessible portions of each vessel. Bilateral testing is considered an integral part of a complete examination. Limited examinations for reoccurring indications may be performed as noted.  Right Venous Findings: +---------+---------------+---------+-----------+----------+-------+          CompressibilityPhasicitySpontaneityPropertiesSummary +---------+---------------+---------+-----------+----------+-------+ CFV      Full           Yes      Yes                          +---------+---------------+---------+-----------+----------+-------+ SFJ      Full                                                 +---------+---------------+---------+-----------+----------+-------+ FV Prox  Full                                                 +---------+---------------+---------+-----------+----------+-------+ FV Mid   Full                                                 +---------+---------------+---------+-----------+----------+-------+ FV DistalFull                                                 +---------+---------------+---------+-----------+----------+-------+ PFV      Full                                                 +---------+---------------+---------+-----------+----------+-------+ POP      Full           Yes      Yes                           +---------+---------------+---------+-----------+----------+-------+ PTV      Full                                                 +---------+---------------+---------+-----------+----------+-------+ PERO     Full                                                 +---------+---------------+---------+-----------+----------+-------+  Left Venous Findings: +---------+---------------+---------+-----------+----------+-------+          CompressibilityPhasicitySpontaneityPropertiesSummary +---------+---------------+---------+-----------+----------+-------+ CFV      Full           Yes      Yes                          +---------+---------------+---------+-----------+----------+-------+ SFJ      Full                                                 +---------+---------------+---------+-----------+----------+-------+ FV Prox  Full                                                 +---------+---------------+---------+-----------+----------+-------+ FV Mid   Full                                                 +---------+---------------+---------+-----------+----------+-------+ FV DistalFull                                                 +---------+---------------+---------+-----------+----------+-------+ PFV      Full                                                 +---------+---------------+---------+-----------+----------+-------+ POP      Full           Yes      Yes                          +---------+---------------+---------+-----------+----------+-------+ PTV      Full                                                 +---------+---------------+---------+-----------+----------+-------+ PERO     Full                                                 +---------+---------------+---------+-----------+----------+-------+    Summary: Right: There is no evidence of deep vein thrombosis in the lower extremity. No cystic structure found in the  popliteal fossa. Left: There is no evidence of deep vein thrombosis in the lower extremity. No cystic structure found in the popliteal fossa.  *See table(s) above for measurements and observations. Electronically signed by Waverly Ferrari MD on 03/11/2018 at 5:11:39 PM.    Final    Recent Labs    03/11/18 0631  WBC 7.6  HGB 11.0*  HCT 34.5*  PLT 445*   Recent Labs    03/11/18 0631  NA 137  K 3.6  CL 98  CO2 29  GLUCOSE 111*  BUN 18  CREATININE 0.98  CALCIUM 8.8*    Intake/Output Summary (Last 24 hours) at 03/13/2018 1309 Last data filed at 03/13/2018 1124 Gross per 24 hour  Intake 480 ml  Output 1305 ml  Net -825 ml     Physical Exam: Vital Signs Blood pressure 132/82, pulse 79, temperature 98.1 F (36.7 C), temperature source Oral, resp. rate 18, weight 72 kg, SpO2 100 %. Constitutional: No distress . Vital signs reviewed. HEENT: EOMI, oral membranes moist Neck: supple Cardiovascular: RRR without murmur. No JVD    Respiratory: CTA Bilaterally without wheezes or rales. Normal effort    GI: BS +, non-tender, non-distended  Musculoskeletal: He exhibits edema. He exhibits no tenderness.  No edema or tenderness in extremities  Neurological: He is alert and oriented to person, place, and time.  Motor: Bilateral upper extremities: 5/5 proximal to distal Bilateral lower extremities: 0/5 proximal distal Sensory diminished below T10, some residual at level of injury, 0/2 below--no change Skin: Skin is warm and dry. He is not diaphoretic.  Back incision CDI with glue Psychiatric: pleasant and cooperative     Assessment/Plan: 1. Functional deficits secondary to thoracic spinal cord injury which require 3+ hours per day of interdisciplinary therapy in a comprehensive inpatient rehab setting.  Physiatrist is providing close team supervision and 24 hour management of active medical problems listed below.  Physiatrist and rehab team continue to assess barriers to  discharge/monitor patient progress toward functional and medical goals  Care Tool:  Bathing  Bathing activity did not occur: Refused Body parts bathed by patient: Right arm, Left arm, Chest, Abdomen(ub only)     Body parts n/a: Front perineal area, Buttocks   Bathing assist Assist Level: Set up assist     Upper Body Dressing/Undressing Upper body dressing   What is the patient wearing?: Pull over shirt, Orthosis Orthosis activity level: Performed by helper  Upper body assist Assist Level: Minimal Assistance - Patient > 75%    Lower Body Dressing/Undressing Lower body dressing      What is the patient wearing?: Pants     Lower body assist Assist for lower body dressing: Supervision/Verbal cueing(bed level)     Toileting Toileting Toileting Activity did not occur (Clothing management and hygiene only): N/A (no void or bm)  Toileting assist Assist for toileting: Supervision/Verbal cueing     Transfers Chair/bed transfer  Transfers assist     Chair/bed transfer assist level: Moderate Assistance - Patient 50 - 74%     Locomotion Ambulation   Ambulation assist   Ambulation activity did not occur: Safety/medical concerns          Walk 10 feet activity   Assist  Walk 10 feet activity did not occur: Safety/medical concerns        Walk 50 feet activity   Assist Walk 50 feet with 2 turns activity did not occur: Safety/medical concerns         Walk 150 feet activity   Assist Walk 150 feet activity did not occur: Safety/medical concerns         Walk 10 feet on uneven surface  activity   Assist Walk 10 feet on uneven surfaces activity did not occur: Safety/medical concerns         Wheelchair     Assist Will patient use wheelchair at discharge?: Yes Type of Wheelchair: Manual    Wheelchair assist level: Supervision/Verbal cueing Max wheelchair distance: 150'  Wheelchair 50 feet with 2 turns activity    Assist         Assist Level: Supervision/Verbal cueing   Wheelchair 150 feet activity     Assist     Assist Level: Supervision/Verbal cueing     Medical Problem List and Plan: 1.  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  DVT Prophylaxis/Anticoagulation: Pharmaceutical: Lovenox bid x 3 months.   -dopplers were negative  3. Pain Management: oxycodone prn.  4. Mood: continue to provide positive reinforcement  5. Neuropsych: This patient is capable of making decisions on his own behalf. 6. Skin/Wound Care: routine pressure relief measures.  7. Fluids/Electrolytes/Nutrition: encourage PO    8. ABLA:  F/u hgb 11 9. Neurogenic bladder: I/O cath every 4-6 hours to keep volumes less than 350 cc.  follow volumes and patterns.   -urine with odor this morning---observe 10 Neurogenic bowel: off Miralax and colace for loose stool  -continue daily suppository    -begin senna-s tonight  11. Orthostatic symptoms?: orthostasis minimal  -continue  ACE/TEDs.     abdominal binder if needed for hypotension.     LOS: 3 days A FACE TO FACE EVALUATION WAS PERFORMED  Ranelle Oyster 03/13/2018, 1:09 PM

## 2018-03-13 NOTE — Progress Notes (Signed)
Occupational Therapy Session Note  Patient Details  Name: Erik [name] MRN: [id number] Date of Birth: Mar 25, 1966  Today's Date: 03/13/2018 OT Individual Time: 1345-1445 OT Individual Time Calculation (min): 60 min    Short Term Goals: Week 1:  OT Short Term Goal 1 (Week 1): Pt will perform LB dressing with mod A for sitting balance with task.  OT Short Term Goal 2 (Week 1): Pt will perform mod A transfer onto commode/ drop arm commode chair.  OT Short Term Goal 3 (Week 1): Pt will perform bathing with mod A overall to decreased A with self care.  Skilled Therapeutic Interventions/Progress Updates:    Patient ready for therapy session.  States that his lower back and bottom are sore, completed transfer, change of position and weight shifts t/o session.  Reviewed lateral weight shift that patient reports is easier to manage and more comfortable. Functional transfers:  SB to/from mat with mod A, min A to set up w/c, patient demonstrates good recall of information reviewed yesterday, SSP to LSP on mat with max A, reviewed optimal positioning and stretching/mobility needed for LB dressing  Functional mobility: mod I to propel w/c to/from therapy gym TA:  Unsupported sitting with CG/min A for reaching and bilateral UE holding of objects Patient with excellent effort t/o session.  Reviewed environmental options for discharge as patient states that he plans to move to small first floor apartment in increase accessibility Patient remained in w/c with seat belt alarm set at close of session.  Therapy Documentation Precautions:  Precautions Precautions: Back Precaution Comments: TLSO worn when OOB Required Braces or Orthoses: Spinal Brace Spinal Brace: Thoracolumbosacral orthotic, Applied in sitting position Restrictions Weight Bearing Restrictions: No General:   Vital Signs: Therapy Vitals Temp: 97.6 F (36.4 C) Pulse Rate: 81 Resp: 18 BP: 134/90 Patient Position (if appropriate):  Sitting Oxygen Therapy SpO2: 100 % O2 Device: Room Air Pain: Pain Assessment Pain Scale: 0-10 Pain Score: 4  Pain Location: Back Pain Intervention(s): Repositioned;Other (Comment)(weight shift)  Therapy/Group: Individual Therapy  Barrie LymeStacey A Peniel Biel 03/13/2018, 4:20 PM

## 2018-03-13 NOTE — Progress Notes (Signed)
Occupational Therapy Session Note  Patient Details  Name: Erik [name] MRN: [id number] Date of Birth: 1965-05-09  Today's Date: 03/13/2018 OT Individual Time: 6295-28410705-0815 OT Individual Time Calculation (min): 70 min    Short Term Goals: Week 1:  OT Short Term Goal 1 (Week 1): Pt will perform LB dressing with mod A for sitting balance with task.  OT Short Term Goal 2 (Week 1): Pt will perform mod A transfer onto commode/ drop arm commode chair.  OT Short Term Goal 3 (Week 1): Pt will perform bathing with mod A overall to decreased A with self care.  Skilled Therapeutic Interventions/Progress Updates:    Upon entering the room, pt supine in bed with no c/o pain and agreeable to OT intervention. Focus on self care retraining with use of long sitting and circle sitting positions. Pt washing UB with set up A and donning pull over shirt with set up as well. Pt pulling self into long sitting position with use of bed rails and min A. Pt pulling LEs into circle sitting to thread pants onto B feet while wearing non slip socks to keep feet from sliding on bed. Pt able to don and pull over B hips while rolling L <> R with min- mod A for sitting balance. Log roll with max A to sit onto EOB. Mod A slide board transfer this session from wheelchair >bed. Pt set up with breakfast tray and belt alarm donned for safety. Call bell and all needed items within reach upon exiting the room.   Therapy Documentation Precautions:  Precautions Precautions: Back Precaution Comments: TLSO worn when OOB Required Braces or Orthoses: Spinal Brace Spinal Brace: Thoracolumbosacral orthotic, Applied in sitting position Restrictions Weight Bearing Restrictions: No Pain: Pain Assessment Pain Scale: 0-10 Pain Score: 2  Pain Type: Acute pain Pain Location: Back Pain Orientation: Mid Pain Intervention(s): Medication (See eMAR)   Therapy/Group: Individual Therapy  Alen BleacherBradsher, Laylaa Guevarra P 03/13/2018, 9:27 AM

## 2018-03-13 NOTE — IPOC Note (Signed)
Overall Plan of Care [facility]) Patient Details Name: Erik [name] MRN: [id number] DOB: 1965-09-13  Admitting Diagnosis: <principal problem not specified>  Hospital Problems: Active Problems:   Closed fracture of T11 vertebra with spinal cord injury (HCC)   Acute blood loss anemia   Orthostasis   Neurogenic bladder   Neurogenic bowel   Paraplegia (HCC)     Functional Problem List: Nursing Bladder, Pain, Bowel, Medication Management, Skin Integrity  PT Balance, Endurance, Motor, Pain, Safety, Sensory  OT Balance, Endurance, Pain, Safety  SLP    TR         Basic ADL's: OT Grooming, Bathing, Dressing, Toileting     Advanced  ADL's: OT Simple Meal Preparation     Transfers: PT Bed Mobility, Bed to Chair, Car, [employer], Floor  OT Toilet     Locomotion: PT Ambulation, Psychologist, prison and probation services, Stairs     Additional Impairments: OT None  SLP        TR      Anticipated Outcomes Item Anticipated Outcome  Self Feeding n/a  Swallowing      Basic self-care  mod I   Toileting  mod I    Bathroom Transfers mod I  Bowel/Bladder  pt to perform I/O cath and bowel program with min assistance  Transfers  Mod I  Locomotion  Mod I at manual w/c level  Communication     Cognition     Pain  less than 3   Safety/Judgment  to remian fall free while in rehab   Therapy Plan: PT Intensity: Minimum of 1-2 x/day ,45 to 90 minutes PT Frequency: 5 out of 7 days PT Duration Estimated Length of Stay: 24-27 days OT Intensity: Minimum of 1-2 x/day, 45 to 90 minutes OT Frequency: 5 out of 7 days OT Duration/Estimated Length of Stay: 24-28 days      Team Interventions: Nursing Interventions Patient/Family Education, Skin Care/Wound Management, Discharge Planning, Bladder Management, Pain Management, Psychosocial Support, Bowel Management, Medication Management  PT interventions Balance/vestibular training, Community reintegration, Discharge planning, Disease management/prevention,  DME/adaptive equipment instruction, Functional mobility training, Neuromuscular re-education, Pain management, Patient/family education, Psychosocial support, Skin care/wound management, Therapeutic Activities, Therapeutic Exercise, UE/LE Strength taining/ROM, UE/LE Coordination activities, Wheelchair propulsion/positioning  OT Interventions Warden/ranger, Fish farm manager, Patient/family education, Therapeutic Activities, Wheelchair propulsion/positioning, Therapeutic Exercise, Community reintegration, Functional mobility training, Self Care/advanced ADL retraining, UE/LE Strength taining/ROM, Discharge planning, UE/LE Coordination activities  SLP Interventions    TR Interventions    SW/CM Interventions Discharge Planning, Psychosocial Support, Patient/Family Education   Barriers to Discharge MD  Medical stability  Nursing Neurogenic Bowel & Bladder    PT Inaccessible home environment, Decreased caregiver support, Medical stability, Home environment access/layout, Incontinence, Neurogenic Bowel & Bladder    OT Other (comments) none known at this time  SLP      SW       Team Discharge Planning: Destination: PT-Home ,OT- Home , SLP-  Projected Follow-up: PT-Home health PT, OT-  Home health OT, SLP-  Projected Equipment Needs: PT-Wheelchair (measurements), Wheelchair cushion (measurements), To be determined, OT- To be determined, SLP-  Equipment Details: PT-manual w/c TBD, OT-  Patient/family involved in discharge planning: PT- Patient,  OT-Patient, SLP-   MD ELOS: 24-28 days Medical Rehab Prognosis:  Excellent Assessment: The patient has been admitted for CIR therapies with the diagnosis of thoracic SCI. Marland Kitchen The team will be addressing functional mobility, strength, stamina, balance, safety, adaptive techniques and equipment, self-care, bowel and bladder mgt, patient and caregiver education, NMR, pain  control, brace don/doff. Goals have been set at mod I with  mobility, bowel and bladder, self-care.    Ranelle OysterZachary T. Grason Brailsford, MD, FAAPMR      See Team Conference Notes for weekly updates to the plan of care

## 2018-03-13 NOTE — Progress Notes (Signed)
Physical Therapy Session Note  Patient Details  Name: Phineas InchesGlen Amaker MRN: [id number] Date of Birth: [birth year]  Today's Date: 03/13/2018 PT Individual Time: 0900-1005 PT Individual Time Calculation (min): 65 min   Short Term Goals: Week 1:  PT Short Term Goal 1 (Week 1): Pt will perform bed mobility with mod A consistently PT Short Term Goal 2 (Week 1): Pt will maintain sitting balance EOB with SBA consistently PT Short Term Goal 3 (Week 1): Pt will be able to recall pressure relief techniques  Skilled Therapeutic Interventions/Progress Updates:   Pt seated in w/c.    W/c propulsion using bil UEs on level tile, supervision, x 150', and on carpet x 25'.  Cues for efficiency, tight turns and caster function when backing up.  Pt remembered to lock brakes safely throughout session.  Strengthening exs using UBE at level 5 x 6 minutes, backwards.  Use of w/c in ADL kitchen. Pt washed hands at sink with cues for position of w/c.  Also w/c position and dynamic seated balance activities : managing clothes on hangers then hanging up onto pole, manipulating food items off/on lowest shelf of cabinet, opening refrigerator and oven; all with cues.  Discussed pressure reliefs with pt, at length; he is aware of schedule .  He stated that his bottom goes numb when he sits too long.  Pt performed press up x 25 seconds.  He is unable to maintain x 30 seconds yet. Pt noted to perform Valsalva maneuver when pressing up. He stated that during a press-up is usually when he has a bowel accident.  PT educated pt about avoiding Valsalva to avoid effects on vascular and GI systems. Pt performed a press-up x 10 seconds counting aloud to avoid Valsalva.   PT educated pt and assisted pt in lateral lean pressure relief, after moving armrest out of the way.   Pt left resting in w/c with needs at hand and seat belt alarm set. Alarm for pressure relief placed nearby.    Therapy Documentation Precautions:   Precautions Precautions: Back Precaution Comments: TLSO worn when OOB Required Braces or Orthoses: Spinal Brace Spinal Brace: Thoracolumbosacral orthotic, Applied in sitting position Restrictions Weight Bearing Restrictions: No   Pain: Pain Assessment Pain Score: 0    Therapy/Group: Individual Therapy  Kirtis Challis 03/13/2018, 10:14 AM

## 2018-03-14 ENCOUNTER — Inpatient Hospital Stay (HOSPITAL_COMMUNITY): Payer: BLUE CROSS/BLUE SHIELD | Admitting: Occupational Therapy

## 2018-03-14 ENCOUNTER — Inpatient Hospital Stay (HOSPITAL_COMMUNITY): Payer: BLUE CROSS/BLUE SHIELD | Admitting: Physical Therapy

## 2018-03-14 MED ORDER — CALCIUM POLYCARBOPHIL 625 MG PO TABS
625.0000 mg | ORAL_TABLET | Freq: Every day | ORAL | Status: DC
  Administered 2018-03-14 – 2018-04-02 (×20): 625 mg via ORAL
  Filled 2018-03-14 (×20): qty 1

## 2018-03-14 MED ORDER — SACCHAROMYCES BOULARDII 250 MG PO CAPS
250.0000 mg | ORAL_CAPSULE | Freq: Two times a day (BID) | ORAL | Status: DC
  Administered 2018-03-14 – 2018-04-11 (×42): 250 mg via ORAL
  Filled 2018-03-14 (×50): qty 1

## 2018-03-14 MED ORDER — GABAPENTIN 300 MG PO CAPS
300.0000 mg | ORAL_CAPSULE | Freq: Three times a day (TID) | ORAL | Status: DC
  Administered 2018-03-14 – 2018-04-03 (×81): 300 mg via ORAL
  Filled 2018-03-14 (×88): qty 1

## 2018-03-14 NOTE — Progress Notes (Signed)
Occupational Therapy Session Note  Patient Details  Name: Erik [name] MRN: [id number] Date of Birth: 08-12-1965  Today's Date: 03/14/2018 OT Individual Time: 0930-1015 OT Individual Time Calculation (min): 45 min    Short Term Goals: Week 1:  OT Short Term Goal 1 (Week 1): Pt will perform LB dressing with mod A for sitting balance with task.  OT Short Term Goal 2 (Week 1): Pt will perform mod A transfer onto commode/ drop arm commode chair.  OT Short Term Goal 3 (Week 1): Pt will perform bathing with mod A overall to decreased A with self care.  Skilled Therapeutic Interventions/Progress Updates:    Pt received in w/c and agreeable to therapy.  Pt taken to gym and used slide board to mat to R side with mod A.  2nd person to hold w/c for safety.  From EOB pt worked on various exercises to increase sitting balance.  He worked on Management consultant with only one hand support, moving hands back and forth, lifting B hands off, leaning side to side on each hand and leaning side to side with forearm on medium therapy ball rolling ball out.  Pt very nervous about falling and needed encouragement to try each exercise.  He only needed CGA to min to stabilize balance with small shifts to L and R as pt very hesistant to lean far.  He transferred back to wc to L side with only min A on board.  Pt taken back to room with all needs met. Belt alarm in place.  Therapy Documentation Precautions:  Precautions Precautions: Back Precaution Comments: TLSO worn when OOB Required Braces or Orthoses: Spinal Brace Spinal Brace: Thoracolumbosacral orthotic, Applied in sitting position Restrictions Weight Bearing Restrictions: No       Pain: pt tolerated session well. Stated he had aching feeling in B hips.     Therapy/Group: Individual Therapy  [name] 03/14/2018, 11:34 AM

## 2018-03-14 NOTE — Progress Notes (Signed)
Incontinent of urine at 0015, I & O cath =400cc's. At 0600, I & O cath=400cc's. Supp given this AM, loose stool with dig.stim. Wore Bilateral PRAFO's all night. Erik MartinezMurray, Zari Cly A

## 2018-03-14 NOTE — Care Management (Signed)
[facility] Individual Statement of Services  Patient Name:  Erik [name]  Date:  03/14/2018  Welcome to the [facility].  Our goal is to provide you with an individualized program based on your diagnosis and situation, designed to meet your specific needs.  With this comprehensive rehabilitation program, you will be expected to participate in at least 3 hours of rehabilitation therapies Monday-Friday, with modified therapy programming on the weekends.  Your rehabilitation program will include the following services:  Physical Therapy (PT), Occupational Therapy (OT), 24 hour per day rehabilitation nursing, Therapeutic Recreaction (TR), Neuropsychology, Case Management (Social Worker), Rehabilitation Medicine, Nutrition Services and Pharmacy Services  Weekly team conferences will be held on Tuesdays to discuss your progress.  Your Social Worker will talk with you frequently to get your input and to update you on team discussions.  Team conferences with you and your family in attendance may also be held.  Expected length of stay: 4 weeks   Overall anticipated outcome: mod independent from wheelchair Depending on your progress and recovery, your program may change. Your Social Worker will coordinate services and will keep you informed of any changes. Your Social Worker's name and contact numbers are listed  below.  The following services may also be recommended but are not provided by the [facility]:   Driving Evaluations  Home Health Rehabiltiation Services  Outpatient Rehabilitation Services  Vocational Rehabilitation   Arrangements will be made to provide these services after discharge if needed.  Arrangements include referral to agencies that provide these services.  Your insurance has been verified to be:  [employer] Your primary doctor is:    Pertinent information will be shared with your doctor and your insurance  company.  Social Worker:  [name], [place] [phone] or ([phone]) [phone]   Information discussed with and copy given to patient by: Amada JupiterHOYLE, Sabriel Borromeo, 03/14/2018, 2:32 PM

## 2018-03-14 NOTE — Progress Notes (Signed)
Physical Therapy Session Note  Patient Details  Name: Erik [name] MRN: [id number] Date of Birth: 08-08-1965  Today's Date: 03/14/2018 PT Individual Time: 1100-1155 PT Individual Time Calculation (min): 55 min   Short Term Goals: Week 1:  PT Short Term Goal 1 (Week 1): Pt will perform bed mobility with mod A consistently PT Short Term Goal 2 (Week 1): Pt will maintain sitting balance EOB with SBA consistently PT Short Term Goal 3 (Week 1): Pt will be able to recall pressure relief techniques  Skilled Therapeutic Interventions/Progress Updates:    Patient received up in chair, very pleasant and willing to work with therapy. Able to self-propel WC approximately 132f to PT gym, and able to perform chair to table transfer with [place] board for transfer as well, education for correct board placement. Focused this session on sitting balance in static and dynamic scenarios, patient initially very anxious and scared about falling even in static positions, worked on decreasing BOS from UEs and maintaining with min guard. Introduced isometrics for active core muscles in all directions with patient with good activation and reporting "that really gave me confidence, I understand now I'm not supposed to just give in when I feel myself losing balance!!". After this exercise he was able to maintain static sitting balance with hands in lap with distant S throughout rest of session. Also worked on dDietitianby pushing bar forward and overhead with min guard, tossing horseshoes, and building jenga tower/playing jenga with hands in lap and min guard with occasional MinA, confidence much improved following seated isometric exercise. [facility] with MBeloitand performed chest and overhead presses with 3# weight in supported sitting. He was able to self-propel WC back to his room with S and was [facility] active, all other needs met this  morning.   Therapy Documentation Precautions:  Precautions Precautions: Back Precaution Comments: TLSO worn when OOB Required Braces or Orthoses: Spinal Brace Spinal Brace: Thoracolumbosacral orthotic, Applied in sitting position Restrictions Weight Bearing Restrictions: No General:   Vital Signs:   Pain: Pain Assessment Pain Scale: 0-10 Pain Score: 0-No pain    Therapy/Group: Individual Therapy  KDeniece ReePT, DPT, CBIS  Supplemental Physical Therapist [facility]   Pager [phone] Rehab Office [phone]  03/14/2018, 12:05 PM

## 2018-03-14 NOTE — Progress Notes (Signed)
Occupational Therapy Session Note  Patient Details  Name: Erik [name] MRN: [id number] Date of Birth: 02-25-1966  Today's Date: 03/14/2018 OT Individual Time: [id number] OT Individual Time Calculation (min): 94 min   Short Term Goals: Week 1:  OT Short Term Goal 1 (Week 1): Pt will perform LB dressing with mod A for sitting balance with task.  OT Short Term Goal 2 (Week 1): Pt will perform mod A transfer onto commode/ drop arm commode chair.  OT Short Term Goal 3 (Week 1): Pt will perform bathing with mod A overall to decreased A with self care.  Skilled Therapeutic Interventions/Progress Updates:    Pt greeted in bed with no c/o pain. Agreeable to complete bathing/dressing. Tx focus on adaptive self care skills and functional transfers. Pt completed UB self care with setup, and then utilized circle and supported long sitting for washing LEs, donning Teds, pants, and gripper socks. He pulled up with B rails for OT to wash back. Pt able to assist OT with Teds with setup of adaptive technique and instruction. To pull pants over hips, pt rolled Rt>Lt with Mod A for LE positioning. He opted to not complete perihygiene due to just having been changed by RN staff prior to OT arrival. Pt reporting having a tough time staying up in w/c for a long period of time yesterday. Issued TIS and educated pt on pressure relief opportunities with this w/c. Supine<sit completed with Mod A for LE guidance. Once EOB, pt with incontinent BM, and returned to supine for brief change. He rolled Rt>Lt in manner as written above for perihygiene and elevating pants over hips. Once back EOB, TLSO donned by OT and he completed slideboard transfer<w/c with Mod A and cues for head/hip relationship. He was able to scoot himself back into chair. Pt reclined for comfort in TIS with LEs elevated. Safety belt fastened and all needs within reach at time of departure.   Therapy Documentation Precautions:  Precautions Precautions:  Back Precaution Comments: TLSO worn when OOB Required Braces or Orthoses: Spinal Brace Spinal Brace: Thoracolumbosacral orthotic, Applied in sitting position Restrictions Weight Bearing Restrictions: No Pain: No c/o pain during tx  Pain Assessment Pain Scale: 0-10 Pain Score: 0-No pain ADL:        Therapy/Group: Individual Therapy  Daysha Ashmore A Jaelon Gatley 03/14/2018, 12:30 PM

## 2018-03-14 NOTE — Progress Notes (Signed)
Physical Therapy Session Note  Patient Details  Name: Erik [name] Denslow MRN: [id number] Date of Birth: 1965-12-20  Today's Date: 03/14/2018 PT Individual Time: 1430-1530 PT Individual Time Calculation (min): 60 min   Short Term Goals: Week 1:  PT Short Term Goal 1 (Week 1): Pt will perform bed mobility with mod A consistently PT Short Term Goal 2 (Week 1): Pt will maintain sitting balance EOB with SBA consistently PT Short Term Goal 3 (Week 1): Pt will be able to recall pressure relief techniques  Skilled Therapeutic Interventions/Progress Updates:    Pt received seated in w/c in room, agreeable to PT. Pt reports occasional soreness in buttock region, has been provided with reclining back w/c and Roho cushion for improved pressure relief management. Pt reports he has been following his pressure relief schedule and either performing press-ups or lateral leans for pressure relief every 30 minutes. Pt reports decrease in soreness with new w/c and cushion. Manual w/c propulsion x 150 ft with BUE and Supervision. Min cueing for setting up w/c for safe transfer to mat table. Sliding board transfer w/c to mat table with mod A. Sitting balance EOM with SBA. Pt reports he has found his "center" and is able to balance without BUE support and close SBA. Pt reports he can feel if he is starting to fall one way and correct by leaning the opposite direction. Sit to supine on wedge on therapy mat with mod A for BLE management. Reviewed BLE PROM stretching program, handout provided and will continue with education with patient and family during rehab stay. Supine to sit max A for BLE management and trunk control. Sliding board transfer mat table to w/c with min A. Pt left reclined in w/c in room with needs in reach, quick release belt and chair alarm in place.  Therapy Documentation Precautions:  Precautions Precautions: Back Precaution Comments: TLSO worn when OOB Required Braces or Orthoses: Spinal Brace Spinal  Brace: Thoracolumbosacral orthotic, Applied in sitting position Restrictions Weight Bearing Restrictions: No   Therapy/Group: Individual Therapy  Peter Congoaylor Traci Plemons, PT, DPT  03/14/2018, 3:48 PM

## 2018-03-14 NOTE — Progress Notes (Signed)
[place] PHYSICAL MEDICINE & REHABILITATION PROGRESS NOTE   Subjective/Complaints: Had some back pain when sitting up during the day yesterday. Otherwise doing well  ROS: Patient denies fever, rash, sore throat, blurred vision, nausea, vomiting, diarrhea, cough, shortness of breath or chest pain, joint or back pain, headache, or mood change.    Objective:   No results found. No results for input(s): WBC, HGB, HCT, PLT in the last 72 hours. No results for input(s): NA, K, CL, CO2, GLUCOSE, BUN, CREATININE, CALCIUM in the last 72 hours.  Intake/Output Summary (Last 24 hours) at 03/14/2018 1330 Last data filed at 03/14/2018 1218 Gross per 24 hour  Intake 600 ml  Output 1450 ml  Net -850 ml     Physical Exam: Vital Signs Blood pressure 124/88, pulse 79, temperature 98.5 F (36.9 C), temperature source Oral, resp. rate 18, weight 72 kg, SpO2 98 %. Constitutional: No distress . Vital signs reviewed. HEENT: EOMI, oral membranes moist Neck: supple Cardiovascular: RRR without murmur. No JVD    Respiratory: CTA Bilaterally without wheezes or rales. Normal effort    GI: BS +, non-tender, non-distended  Musculoskeletal: He exhibits edema. He exhibits no tenderness.  No edema or tenderness in extremities  Neurological: He is alert and oriented to person, place, and time.  Motor: Bilateral upper extremities: 5/5 proximal to distal Bilateral lower extremities: 0/5 proximal distal Sensory diminished below T10, some residual at level of injury, 0/2 below--no changes Skin: Skin is warm and dry. He is not diaphoretic.  Back incision CDI with glue/tape Psychiatric: pleasant     Assessment/Plan: 1. Functional deficits secondary to thoracic spinal cord injury which require 3+ hours per day of interdisciplinary therapy in a comprehensive inpatient rehab setting.  Physiatrist is providing close team supervision and 24 hour management of active medical problems listed below.  Physiatrist  and rehab team continue to assess barriers to discharge/monitor patient progress toward functional and medical goals  Care Tool:  Bathing  Bathing activity did not occur: Refused Body parts bathed by patient: Right arm, Left arm, Chest, Abdomen, Right upper leg, Left upper leg, Right lower leg, Left lower leg, Face   Body parts bathed by helper: Buttocks Body parts n/a: Front perineal area   Bathing assist Assist Level: Supervision/Verbal cueing     Upper Body Dressing/Undressing Upper body dressing   What is the patient wearing?: Pull over shirt, Orthosis Orthosis activity level: Performed by helper  Upper body assist Assist Level: Minimal Assistance - Patient > 75%    Lower Body Dressing/Undressing Lower body dressing      What is the patient wearing?: Pants     Lower body assist Assist for lower body dressing: Minimal Assistance - Patient > 75%     Toileting Toileting Toileting Activity did not occur (Clothing management and hygiene only): N/A (no void or bm)  Toileting assist Assist for toileting: Supervision/Verbal cueing     Transfers Chair/bed transfer  Transfers assist     Chair/bed transfer assist level: Moderate Assistance - Patient 50 - 74%     Locomotion Ambulation   Ambulation assist   Ambulation activity did not occur: Safety/medical concerns          Walk 10 feet activity   Assist  Walk 10 feet activity did not occur: Safety/medical concerns        Walk 50 feet activity   Assist Walk 50 feet with 2 turns activity did not occur: Safety/medical concerns  Walk 150 feet activity   Assist Walk 150 feet activity did not occur: Safety/medical concerns         Walk 10 feet on uneven surface  activity   Assist Walk 10 feet on uneven surfaces activity did not occur: Safety/medical concerns         Wheelchair     Assist Will patient use wheelchair at discharge?: Yes Type of Wheelchair: Manual     Wheelchair assist level: Supervision/Verbal cueing Max wheelchair distance: 150'    Wheelchair 50 feet with 2 turns activity    Assist        Assist Level: Supervision/Verbal cueing   Wheelchair 150 feet activity     Assist     Assist Level: Supervision/Verbal cueing     Medical Problem List and Plan: 1.  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  DVT Prophylaxis/Anticoagulation: Pharmaceutical: Lovenox bid x 3 months.   -dopplers were negative  3. Pain Management: oxycodone prn.  4. Mood: continue to provide positive reinforcement  5. Neuropsych: This patient is capable of making decisions on his own behalf. 6. Skin/Wound Care: routine pressure relief measures.  7. Fluids/Electrolytes/Nutrition: encourage PO    8. ABLA:  F/u hgb 11 9. Neurogenic bladder: I/O cath every 4-6 hours to keep volumes less than 350 cc.  follow volumes and patterns.   -urine with odor this morning---observe 10 Neurogenic bowel: off Miralax and colace for loose stool  -continue daily suppository    -add fiber and probiotic to firm up stool  11. Orthostatic symptoms?: orthostasis minimal  -continue  ACE/TEDs when up   abdominal binder if needed for hypotension.     LOS: 4 days A FACE TO FACE EVALUATION WAS PERFORMED  Ranelle OysterZachary T Fletcher Ostermiller 03/14/2018, 1:30 PM

## 2018-03-15 ENCOUNTER — Inpatient Hospital Stay (HOSPITAL_COMMUNITY): Payer: BLUE CROSS/BLUE SHIELD | Admitting: Occupational Therapy

## 2018-03-15 ENCOUNTER — Inpatient Hospital Stay (HOSPITAL_COMMUNITY): Payer: BLUE CROSS/BLUE SHIELD | Admitting: Physical Therapy

## 2018-03-15 NOTE — Progress Notes (Signed)
Physical Therapy Session Note  Patient Details  Name: Erik [name] MRN: [id number] Date of Birth: 07-24-1965  Today's Date: 03/15/2018 PT Individual Time: [id number] PT Individual Time Calculation (min): 43 min   Short Term Goals: Week 1:  PT Short Term Goal 1 (Week 1): Pt will perform bed mobility with mod A consistently PT Short Term Goal 2 (Week 1): Pt will maintain sitting balance EOB with SBA consistently PT Short Term Goal 3 (Week 1): Pt will be able to recall pressure relief techniques  Skilled Therapeutic Interventions/Progress Updates:  Pt was seen bedside in the am. Pt rolled R/L with side rails multiple times with min A to assist with hygiene. Pt dependent for hygiene and brief placement. Pt dependent donning TED hoses. Pt rolled R/L with siderails and min A to assist with donning TLSO. Pt transferred supine to edge of bed with mod A and verbal cues. Pt tolerated Edge of bed with UE support and c/s with verbal cues. Pt transferred edge of bed to w/c with sliding board and min to mod A with verbal cues. Pt able to attach foot rests with S and verbal cues. Pt left sitting up in w/c with chair alarm in place and call bell within reach.   Therapy Documentation Precautions:  Precautions Precautions: Back Precaution Comments: TLSO worn when OOB Required Braces or Orthoses: Spinal Brace Spinal Brace: Thoracolumbosacral orthotic, Applied in sitting position Restrictions Weight Bearing Restrictions: No General:   Pain: Pt c/o 6/10 pain at surgical site.    Therapy/Group: Individual Therapy  Rayford HalstedMitchell, Benancio Osmundson G 03/15/2018, 12:19 PM

## 2018-03-15 NOTE — Progress Notes (Signed)
[place] PHYSICAL MEDICINE & REHABILITATION PROGRESS NOTE   Subjective/Complaints: Remains up beat. Pleased with progress. Stool more formed yesterday  ROS: Patient denies fever, rash, sore throat, blurred vision, nausea, vomiting, diarrhea, cough, shortness of breath or chest pain, joint or back pain, headache, or mood change.    Objective:   No results found. No results for input(s): WBC, HGB, HCT, PLT in the last 72 hours. No results for input(s): NA, K, CL, CO2, GLUCOSE, BUN, CREATININE, CALCIUM in the last 72 hours.  Intake/Output Summary (Last 24 hours) at 03/15/2018 1200 Last data filed at 03/15/2018 0700 Gross per 24 hour  Intake 240 ml  Output 1025 ml  Net -785 ml     Physical Exam: Vital Signs Blood pressure 115/86, pulse 83, temperature 97.6 F (36.4 C), resp. rate 18, weight 75 kg, SpO2 100 %. Constitutional: No distress . Vital signs reviewed. HEENT: EOMI, oral membranes moist Neck: supple Cardiovascular: RRR without murmur. No JVD    Respiratory: CTA Bilaterally without wheezes or rales. Normal effort    GI: BS +, non-tender, non-distended  Musculoskeletal: He exhibits edema. He exhibits no tenderness.  No edema or tenderness in extremities  Neurological: He is alert and oriented to person, place, and time.  Motor: Bilateral upper extremities: 5/5 proximal to distal Bilateral lower extremities: 0/5 proximal distal--no change Sensory diminished below T10, some residual at level of injury, 0/2 below--no changes Skin: Skin is warm and dry. He is not diaphoretic.  Back incision CDI with glue/tape Psychiatric: pleasant     Assessment/Plan: 1. Functional deficits secondary to thoracic spinal cord injury which require 3+ hours per day of interdisciplinary therapy in a comprehensive inpatient rehab setting.  Physiatrist is providing close team supervision and 24 hour management of active medical problems listed below.  Physiatrist and rehab team continue to  assess barriers to discharge/monitor patient progress toward functional and medical goals  Care Tool:  Bathing  Bathing activity did not occur: Refused Body parts bathed by patient: Right arm, Left arm, Chest, Abdomen, Right upper leg, Left upper leg, Right lower leg, Left lower leg, Face   Body parts bathed by helper: Buttocks Body parts n/a: Front perineal area   Bathing assist Assist Level: Supervision/Verbal cueing     Upper Body Dressing/Undressing Upper body dressing   What is the patient wearing?: Pull over shirt, Orthosis Orthosis activity level: Performed by helper  Upper body assist Assist Level: Minimal Assistance - Patient > 75%    Lower Body Dressing/Undressing Lower body dressing      What is the patient wearing?: Pants     Lower body assist Assist for lower body dressing: Minimal Assistance - Patient > 75%     Toileting Toileting Toileting Activity did not occur (Clothing management and hygiene only): N/A (no void or bm)  Toileting assist Assist for toileting: Supervision/Verbal cueing     Transfers Chair/bed transfer  Transfers assist     Chair/bed transfer assist level: Moderate Assistance - Patient 50 - 74%     Locomotion Ambulation   Ambulation assist   Ambulation activity did not occur: Safety/medical concerns          Walk 10 feet activity   Assist  Walk 10 feet activity did not occur: Safety/medical concerns        Walk 50 feet activity   Assist Walk 50 feet with 2 turns activity did not occur: Safety/medical concerns         Walk 150 feet activity  Assist Walk 150 feet activity did not occur: Safety/medical concerns         Walk 10 feet on uneven surface  activity   Assist Walk 10 feet on uneven surfaces activity did not occur: Safety/medical concerns         Wheelchair     Assist Will patient use wheelchair at discharge?: Yes Type of Wheelchair: Manual    Wheelchair assist level:  Supervision/Verbal cueing Max wheelchair distance: 150'    Wheelchair 50 feet with 2 turns activity    Assist        Assist Level: Supervision/Verbal cueing   Wheelchair 150 feet activity     Assist     Assist Level: Supervision/Verbal cueing     Medical Problem List and Plan: 1.  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  DVT Prophylaxis/Anticoagulation: Pharmaceutical: Lovenox bid x 3 months.   -dopplers were negative  3. Pain Management: oxycodone prn.  4. Mood: continue to provide positive reinforcement  5. Neuropsych: This patient is capable of making decisions on his own behalf. 6. Skin/Wound Care: routine pressure relief measures.  7. Fluids/Electrolytes/Nutrition: encourage PO    8. ABLA:  F/u hgb 11 9. Neurogenic bladder: I/O cath every 4-6 hours to keep volumes less than 350 cc.  follow volumes and patterns.   -urine with odor this morning---but afebrile 10 Neurogenic bowel:    -continue daily suppository    -add fiber and probiotic to firm up stool --stools more formed  -working toward AM program 11. Orthostatic symptoms?:   -continue  ACE/TEDs when up   abdominal binder if needed for hypotension.     LOS: 5 days A FACE TO FACE EVALUATION WAS PERFORMED  Ranelle OysterZachary T Jamoni Broadfoot 03/15/2018, 12:00 PM

## 2018-03-15 NOTE — Progress Notes (Signed)
At 1855, I & O cath=375cc's. Incontinent of urine, bladder scan=97cc's. At 0030 bladder scan=190cc's. At 0550, incontinent of urine, bladder scan=374, I & O cath=300cc's. Dig. stim after supp this AM, small amount of mucous, nothing felt in rectum. Alfredo MartinezMurray, Tremane Spurgeon A

## 2018-03-15 NOTE — Plan of Care (Signed)
  Problem: Consults Goal: RH SPINAL CORD INJURY PATIENT EDUCATION Description  See Patient Education module for education specifics.  03/15/2018 1658 by Kalman Shanotten, Hansika Leaming A, LPN Outcome: Progressing 03/15/2018 1658 by Kalman Shanotten, Nysir Fergusson A, LPN Outcome: Progressing   Problem: SCI BOWEL ELIMINATION Goal: RH STG MANAGE BOWEL WITH ASSISTANCE Description STG Manage Bowel with  Min Assistance.  03/15/2018 1658 by Kalman Shanotten, Zaineb Nowaczyk A, LPN Outcome: Progressing 03/15/2018 1658 by Kalman Shanotten, Joakim Huesman A, LPN Outcome: Progressing Goal: RH STG SCI MANAGE BOWEL WITH MEDICATION WITH ASSISTANCE Description STG SCI Manage bowel with medication with Min assistance.  03/15/2018 1658 by Kalman Shanotten, Elijahjames Fuelling A, LPN Outcome: Progressing 03/15/2018 1658 by Kalman Shanotten, Jakiah Bienaime A, LPN Outcome: Progressing Goal: RH STG MANAGE BOWEL W/EQUIPMENT W/ASSISTANCE Description STG Manage Bowel With Equipment With Min Assistance  03/15/2018 1658 by Kalman Shanotten, Simisola Sandles A, LPN Outcome: Progressing 03/15/2018 1658 by Kalman Shanotten, Elyssa Pendelton A, LPN Outcome: Progressing Goal: RH STG SCI MANAGE BOWEL PROGRAM W/ASSIST OR AS APPROPRIATE Description STG SCI Manage bowel program w/min assist or as appropriate.  03/15/2018 1658 by Kalman Shanotten, Hence Derrick A, LPN Outcome: Progressing 03/15/2018 1658 by Kalman Shanotten, Arty Lantzy A, LPN Outcome: Progressing   Problem: SCI BLADDER ELIMINATION Goal: RH STG MANAGE BLADDER WITH ASSISTANCE Description STG Manage Bladder With Min Assistance  03/15/2018 1658 by Kalman Shanotten, Anita Laguna A, LPN Outcome: Progressing 03/15/2018 1658 by Doran Durandotten, Gurjot Brisco A, LPN Outcome: Progressing Goal: RH STG MANAGE BLADDER WITH EQUIPMENT WITH ASSISTANCE Description STG Manage Bladder With Equipment With min  Assistance  03/15/2018 1658 by Kalman Shanotten, Emary Zalar A, LPN Outcome: Progressing 03/15/2018 1658 by Doran Durandotten, Adriana Lina A, LPN Outcome: Progressing Goal: RH STG SCI MANAGE BLADDER PROGRAM W/ASSISTANCE Description Pt will be Mod I with  equipment and be able to manage bladder with I/O  cath   03/15/2018 1658 by Doran Durandotten, Camarie Mctigue A, LPN Outcome: Progressing 03/15/2018 1658 by Doran Durandotten, Keagon Glascoe A, LPN Outcome: Progressing   Problem: RH SKIN INTEGRITY Goal: RH STG SKIN FREE OF INFECTION/BREAKDOWN Description Pt will remain free from skin breakdown while in rehab with min assist  03/15/2018 1658 by Doran Durandotten, Lyfe Monger A, LPN Outcome: Progressing 03/15/2018 1658 by Kalman Shanotten, Tabitha Riggins A, LPN Outcome: Progressing Goal: RH STG MAINTAIN SKIN INTEGRITY WITH ASSISTANCE Description STG Maintain Skin Integrity With min Assistance.  03/15/2018 1658 by Kalman Shanotten, Koury Roddy A, LPN Outcome: Progressing 03/15/2018 1658 by Doran Durandotten, Burak Zerbe A, LPN Outcome: Progressing   Problem: RH SAFETY Goal: RH STG ADHERE TO SAFETY PRECAUTIONS W/ASSISTANCE/DEVICE Description STG Adhere to Safety Precautions With min Assistance/Device.  03/15/2018 1658 by Kalman Shanotten, Areli Frary A, LPN Outcome: Progressing 03/15/2018 1658 by Doran Durandotten, Romulus Hanrahan A, LPN Outcome: Progressing   Problem: RH PAIN MANAGEMENT Goal: RH STG PAIN MANAGED AT OR BELOW PT'S PAIN GOAL Description Pain will be managed at a goal set of less than 3 out 10 with PRN pain med with mod I assist  03/15/2018 1658 by Doran Durandotten, Maylee Bare A, LPN Outcome: Progressing 03/15/2018 1658 by Doran Durandotten, Keltie Labell A, LPN Outcome: Progressing   Problem: RH KNOWLEDGE DEFICIT SCI Goal: RH STG INCREASE KNOWLEDGE OF SELF CARE AFTER SCI Description Pt will be able to verbalize self care with Mod I assist  03/15/2018 1658 by Kalman Shanotten, Rickia Freeburg A, LPN Outcome: Progressing 03/15/2018 1658 by Kalman Shanotten, Rennee Coyne A, LPN Outcome: Progressing

## 2018-03-15 NOTE — Progress Notes (Signed)
Occupational Therapy Session Note  Patient Details  Name: Erik [name] MRN: [id number] Date of Birth: [birth year]  Today's Date: 03/15/2018 OT Individual Time: [id number] OT Individual Time Calculation (min): 73 min    Short Term Goals: Week 1:  OT Short Term Goal 1 (Week 1): Pt will perform LB dressing with mod A for sitting balance with task.  OT Short Term Goal 2 (Week 1): Pt will perform mod A transfer onto commode/ drop arm commode chair.  OT Short Term Goal 3 (Week 1): Pt will perform bathing with mod A overall to decreased A with self care.  Skilled Therapeutic Interventions/Progress Updates:    Pt greeted in TIS, wanting to complete UB bathing, shaving, and oral care w/c level at sink. He unlocked w/c brakes without twisting torso and self propelled to sink. Pt completed above stated tasks with setup and vcs for TLSO mgt. Afterwards we practiced slideboard transfers to padded [facility]SC. [facility] with vcs for head/hip relationship. While seated, pt able to lower pants utilizing lateral leans. Practiced simulated hygiene while leaning with pt experiencing increased Lt flank pain. He then reported bowel incontinence. Pt used B UEs to squat stand for OT to elevate pants over hips, and he completed board transfer back to w/c with Mod A. Slideboard<bed completed with steady assist, and once TLSO was doffed, he returned to supine. Mod A for LE mgt while pt rolled Rt>Lt during perihygiene. Pt able to elevate pants over hips over hips with OT assist for rolling. He then boosted himself up in bed with use of headboard. Pt was left with all needs within reach and bed alarm set at session exit.   Therapy Documentation Precautions:  Precautions Precautions: Back Precaution Comments: TLSO worn when OOB Required Braces or Orthoses: Spinal Brace Spinal Brace: Thoracolumbosacral orthotic, Applied in sitting position Restrictions Weight Bearing Restrictions: No Pain: No c/o  pain during session  Pain Assessment Pain Scale: 0-10 Pain Score: 0-No pain Faces Pain Scale: No hurt ADL:        Therapy/Group: Individual Therapy  [name] 03/15/2018, 12:49 PM

## 2018-03-15 NOTE — Progress Notes (Signed)
Physical Therapy Session Note  Patient Details  Name: Erik [name] MRN: [id number] Date of Birth: July 10, 1965  Today's Date: 03/15/2018 PT Individual Time: 1300-1414 PT Individual Time Calculation (min): 74 min   Short Term Goals: Week 1:  PT Short Term Goal 1 (Week 1): Pt will perform bed mobility with mod A consistently PT Short Term Goal 2 (Week 1): Pt will maintain sitting balance EOB with SBA consistently PT Short Term Goal 3 (Week 1): Pt will be able to recall pressure relief techniques  Skilled Therapeutic Interventions/Progress Updates:  Pt was seen bedside in the pm. Pt rolled R/L with side rails and min A to assist with donning TLSO. Pt transferred supine to edge of bed with side rail and mod A with verbal cues. Pt transferred edge of bed to w/c and w/c to edge of bed with sliding board and min to mod A with verbal cues. Pt transferred edge of bed to supine with side rail and mod A. Pt rolled R/L with siderail and min A. Pt transferred supine to edge of bed with side rail and mod A with verbal cues. Pt transferred edge of bed to w/c with sliding board and min to mod A with verbal cues. Pt propelled w/c to gym with B UEs and S. Pt rode UE UBE x 20 minutes, at level 1, hill mode. Pt propelled w/c about 600 feet with S and verbal cues. Pt able to get on and off elevator with S and verbal cues. Pt returned to room. Pt able to perform air chair push ups to change out pants as per pt request. Pt left sitting up in w/c with chair alarm in place and all needs within reach.   Therapy Documentation Precautions:  Precautions Precautions: Back Precaution Comments: TLSO worn when OOB Required Braces or Orthoses: Spinal Brace Spinal Brace: Thoracolumbosacral orthotic, Applied in sitting position Restrictions Weight Bearing Restrictions: No General:   Pain: No c/o pain.    Therapy/Group: Individual Therapy  Rayford HalstedMitchell, Erik [name] 03/15/2018, 3:26 PM

## 2018-03-16 ENCOUNTER — Inpatient Hospital Stay (HOSPITAL_COMMUNITY): Payer: Self-pay | Admitting: Occupational Therapy

## 2018-03-16 ENCOUNTER — Inpatient Hospital Stay (HOSPITAL_COMMUNITY): Payer: Self-pay | Admitting: Physical Therapy

## 2018-03-16 NOTE — Progress Notes (Signed)
Moderate mushy results after dig.stim this AM. Erik MartinezMurray, [name] A

## 2018-03-16 NOTE — Progress Notes (Signed)
At 2035, assisted to bed, Incontinent of B & B. Bladder scan=335, I & O cath=250cc's. Large incontinent stool. PRN Oxy IR given at 2125.  At 0030, incontinent of urine, bladder scan=257. +/- sleep. Erik [name], Isiaah Cuervo A

## 2018-03-16 NOTE — Progress Notes (Signed)
[place] PHYSICAL MEDICINE & REHABILITATION PROGRESS NOTE   Subjective/Complaints: No new complaints. Pain reasonable. Working on upper body strength. Stools firming up  ROS: Patient denies fever, rash, sore throat, blurred vision, nausea, vomiting, diarrhea, cough, shortness of breath or chest pain, joint or back pain, headache, or mood change. .    Objective:   No results found. No results for input(s): WBC, HGB, HCT, PLT in the last 72 hours. No results for input(s): NA, K, CL, CO2, GLUCOSE, BUN, CREATININE, CALCIUM in the last 72 hours.  Intake/Output Summary (Last 24 hours) at 03/16/2018 1223 Last data filed at 03/16/2018 0830 Gross per 24 hour  Intake 240 ml  Output 550 ml  Net -310 ml     Physical Exam: Vital Signs Blood pressure 122/87, pulse 84, temperature 97.8 F (36.6 C), resp. rate 18, weight 75 kg, SpO2 100 %. Constitutional: No distress . Vital signs reviewed. HEENT: EOMI, oral membranes moist Neck: supple Cardiovascular: RRR without murmur. No JVD    Respiratory: CTA Bilaterally without wheezes or rales. Normal effort    GI: BS +, non-tender, non-distended  Musculoskeletal: He exhibits edema. He exhibits no tenderness.  No edema or tenderness in extremities  Neurological: He is alert and oriented to person, place, and time.  Motor: Bilateral upper extremities: 5/5 proximal to distal Bilateral lower extremities: 0/5 proximal distal--no change Sensory diminished below T10, some residual at level of injury, 0/2 below-no changes Skin: Skin is warm and dry. He is not diaphoretic.  Back incision CDI with glue/tape Psychiatric: pleasant     Assessment/Plan: 1. Functional deficits secondary to thoracic spinal cord injury which require 3+ hours per day of interdisciplinary therapy in a comprehensive inpatient rehab setting.  Physiatrist is providing close team supervision and 24 hour management of active medical problems listed below.  Physiatrist and rehab  team continue to assess barriers to discharge/monitor patient progress toward functional and medical goals  Care Tool:  Bathing  Bathing activity did not occur: Refused Body parts bathed by patient: Right arm, Left arm, Chest, Abdomen, Right upper leg, Left upper leg, Right lower leg, Left lower leg, Face   Body parts bathed by helper: Buttocks Body parts n/a: Front perineal area   Bathing assist Assist Level: Supervision/Verbal cueing     Upper Body Dressing/Undressing Upper body dressing   What is the patient wearing?: Pull over shirt, Orthosis Orthosis activity level: Performed by helper  Upper body assist Assist Level: Minimal Assistance - Patient > 75%    Lower Body Dressing/Undressing Lower body dressing      What is the patient wearing?: Pants     Lower body assist Assist for lower body dressing: Minimal Assistance - Patient > 75%     Toileting Toileting Toileting Activity did not occur (Clothing management and hygiene only): N/A (no void or bm)  Toileting assist Assist for toileting: Supervision/Verbal cueing     Transfers Chair/bed transfer  Transfers assist     Chair/bed transfer assist level: Moderate Assistance - Patient 50 - 74%     Locomotion Ambulation   Ambulation assist   Ambulation activity did not occur: Safety/medical concerns          Walk 10 feet activity   Assist  Walk 10 feet activity did not occur: Safety/medical concerns        Walk 50 feet activity   Assist Walk 50 feet with 2 turns activity did not occur: Safety/medical concerns         Walk 150  feet activity   Assist Walk 150 feet activity did not occur: Safety/medical concerns         Walk 10 feet on uneven surface  activity   Assist Walk 10 feet on uneven surfaces activity did not occur: Safety/medical concerns         Wheelchair     Assist Will patient use wheelchair at discharge?: Yes Type of Wheelchair: Manual    Wheelchair assist  level: Supervision/Verbal cueing Max wheelchair distance: 600    Wheelchair 50 feet with 2 turns activity    Assist        Assist Level: Supervision/Verbal cueing   Wheelchair 150 feet activity     Assist     Assist Level: Supervision/Verbal cueing     Medical Problem List and Plan: 1.  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  DVT Prophylaxis/Anticoagulation: Pharmaceutical: Lovenox bid x 3 months.   -dopplers were negative  3. Pain Management: oxycodone prn.  4. Mood: continue to provide positive reinforcement  5. Neuropsych: This patient is capable of making decisions on his own behalf. 6. Skin/Wound Care: routine pressure relief measures.  7. Fluids/Electrolytes/Nutrition: encourage PO    8. ABLA:  F/u hgb 11 9. Neurogenic bladder: I/O cath every 4-6 hours to keep volumes less than 350 cc.  follow volumes and patterns.   -urine with odor this morning---but afebrile 10 Neurogenic bowel:    -continue daily suppository    -added fiber and probiotic to firm up stool   -working toward AM program 11. Orthostatic symptoms?:   -continue  ACE/TEDs when up   abdominal binder if needed for hypotension.     LOS: 6 days A FACE TO FACE EVALUATION WAS PERFORMED  Ranelle Oyster 03/16/2018, 12:23 PM

## 2018-03-16 NOTE — Plan of Care (Signed)
  Problem: Consults Goal: RH SPINAL CORD INJURY PATIENT EDUCATION Description  See Patient Education module for education specifics.  Outcome: Progressing   Problem: SCI BOWEL ELIMINATION Goal: RH STG MANAGE BOWEL WITH ASSISTANCE Description STG Manage Bowel with  Min Assistance.  Outcome: Progressing Goal: RH STG SCI MANAGE BOWEL WITH MEDICATION WITH ASSISTANCE Description STG SCI Manage bowel with medication with Min assistance.  Outcome: Progressing Goal: RH STG MANAGE BOWEL W/EQUIPMENT W/ASSISTANCE Description STG Manage Bowel With Equipment With Min Assistance  Outcome: Progressing Goal: RH STG SCI MANAGE BOWEL PROGRAM W/ASSIST OR AS APPROPRIATE Description STG SCI Manage bowel program w/min assist or as appropriate.  Outcome: Progressing   Problem: SCI BLADDER ELIMINATION Goal: RH STG MANAGE BLADDER WITH ASSISTANCE Description STG Manage Bladder With Min Assistance  Outcome: Progressing Goal: RH STG MANAGE BLADDER WITH EQUIPMENT WITH ASSISTANCE Description STG Manage Bladder With Equipment With min  Assistance  Outcome: Progressing Goal: RH STG SCI MANAGE BLADDER PROGRAM W/ASSISTANCE Description Pt will be Mod I with equipment and be able to manage bladder with I/O  cath   Outcome: Progressing   Problem: RH SKIN INTEGRITY Goal: RH STG SKIN FREE OF INFECTION/BREAKDOWN Description Pt will remain free from skin breakdown while in rehab with min assist  Outcome: Progressing Goal: RH STG MAINTAIN SKIN INTEGRITY WITH ASSISTANCE Description STG Maintain Skin Integrity With min Assistance.  Outcome: Progressing   Problem: RH SAFETY Goal: RH STG ADHERE TO SAFETY PRECAUTIONS W/ASSISTANCE/DEVICE Description STG Adhere to Safety Precautions With min Assistance/Device.  Outcome: Progressing   Problem: RH PAIN MANAGEMENT Goal: RH STG PAIN MANAGED AT OR BELOW PT'S PAIN GOAL Description Pain will be managed at a goal set of less than 3 out 10 with PRN pain med with mod  I assist  Outcome: Progressing   Problem: RH KNOWLEDGE DEFICIT SCI Goal: RH STG INCREASE KNOWLEDGE OF SELF CARE AFTER SCI Description Pt will be able to verbalize self care with Mod I assist  Outcome: Progressing   

## 2018-03-17 ENCOUNTER — Inpatient Hospital Stay (HOSPITAL_COMMUNITY): Payer: BLUE CROSS/BLUE SHIELD | Admitting: Occupational Therapy

## 2018-03-17 ENCOUNTER — Inpatient Hospital Stay (HOSPITAL_COMMUNITY): Payer: BLUE CROSS/BLUE SHIELD

## 2018-03-17 ENCOUNTER — Inpatient Hospital Stay (HOSPITAL_COMMUNITY): Payer: BLUE CROSS/BLUE SHIELD | Admitting: Physical Therapy

## 2018-03-17 LAB — BASIC METABOLIC PANEL
Anion gap: 9 (ref 5–15)
BUN: 13 mg/dL (ref 6–20)
CHLORIDE: 102 mmol/L (ref 98–111)
CO2: 26 mmol/L (ref 22–32)
CREATININE: 0.94 mg/dL (ref 0.61–1.24)
Calcium: 8.9 mg/dL (ref 8.9–10.3)
GFR calc Af Amer: >60 mL/min (ref >60)
GFR calc non Af Amer: >60 mL/min (ref >60)
Glucose, Bld: 107 mg/dL — ABNORMAL HIGH (ref 70–99)
Potassium: 4.1 mmol/L (ref 3.5–5.1)
Sodium: 137 mmol/L (ref 135–145)

## 2018-03-17 LAB — CBC
HCT: 33.8 % — ABNORMAL LOW (ref 39.0–52.0)
Hemoglobin: 10.7 g/dL — ABNORMAL LOW (ref 13.0–17.0)
MCH: 29.1 pg (ref 26.0–34.0)
MCHC: 31.7 g/dL (ref 30.0–36.0)
MCV: 91.8 fL (ref 80.0–100.0)
NRBC: 0 % (ref 0.0–0.2)
PLATELETS: 461 10*3/uL — AB (ref 150–400)
RBC: 3.68 MIL/uL — ABNORMAL LOW (ref 4.22–5.81)
RDW: 13.2 % (ref 11.5–15.5)
WBC: 4.4 10*3/uL (ref 4.0–10.5)

## 2018-03-17 NOTE — Progress Notes (Signed)
Physical Therapy Session Note  Patient Details  Name: Phineas InchesGlen Buckhalter MRN: [id number] Date of Birth: 12/19/1965  Today's Date: 03/17/2018 PT Individual Time: [id number] PT Individual Time Calculation (min): 45 min   Short Term Goals: Week 1:  PT Short Term Goal 1 (Week 1): Pt will perform bed mobility with mod A consistently PT Short Term Goal 2 (Week 1): Pt will maintain sitting balance EOB with SBA consistently PT Short Term Goal 3 (Week 1): Pt will be able to recall pressure relief techniques  Skilled Therapeutic Interventions/Progress Updates:    Session focused on functional transfer training via slideboard including transfer and w/c parts management, NMR to address trunk control and balance retraining seated EOM, and education in regards to SCI, resources (issued [employer] backpack with resource information), and goal progression. Pt propels w/c on unit modified independent to and from therapy sessions with 1 rest break due to decreased muscular endurance x 100'. During transfer training, pt able to verbalize and perform majority of w/c parts management functions (one armrest required assist due to type of locking mechanism and access point) including legrests and set up and removal of slideboard. Actual transfers required overall min assist for balance. Balance retraining EOM including functional reaching tasks without UE or 1 UE support, walking arms on elbows in lap, and support via therapist in front of patient for limits of stability retraining. Pt overall requires CGA to min assist for limited dynamic activities. Discussed potential use of leg loops for LE management, LE stretching, and how they can assist with balance. Will follow up with primary PT if these have been addressed with patient for measurement.   Therapy Documentation Precautions:  Precautions Precautions: Back Precaution Comments: TLSO worn when OOB Required Braces or Orthoses: Spinal Brace Spinal Brace:  Thoracolumbosacral orthotic, Applied in sitting position Restrictions Weight Bearing Restrictions: No  Pain: No reports of pain.   Therapy/Group: Individual Therapy  Karolee StampsGray, Lynnita Somma Darrol PokeBrescia  Mehar Sagen B. Lanny Lipkin, PT, DPT, CBIS  03/17/2018, 9:33 AM

## 2018-03-17 NOTE — Progress Notes (Signed)
Occupational Therapy Session Note  Patient Details  Name: Erik [name] MRN: [id number] Date of Birth: July 20, 1965  Today's Date: 03/17/2018 OT Individual Time: 0705-0829 OT Individual Time Calculation (min): 84 min    Short Term Goals: Week 1:  OT Short Term Goal 1 (Week 1): Pt will perform LB dressing with mod A for sitting balance with task.  OT Short Term Goal 2 (Week 1): Pt will perform mod A transfer onto commode/ drop arm commode chair.  OT Short Term Goal 3 (Week 1): Pt will perform bathing with mod A overall to decreased A with self care.  Skilled Therapeutic Interventions/Progress Updates:    Upon entering the room, pt supine in bed and reports bowel program started at 6 am but he feels like he is " going". Mod A to roll to R and pt placed onto bed pan. Pt coming into long sitting on bed pan and able to push down to empty this session with encouragement and time. OT notified RN to increase successfulness of morning bowel program. Pt rolling to L and able to perform hygiene with set up A to obtain needed items for hygiene. Pt returning to long sitting and washing self with support of 1 UE with2 LOB but able to self correct with assistance from therapist. Pt donning pants and socks while pulling self into circle sitting and long sitting positions. Mod A supine >sit with assist from therapist to don TLSO brace while pt maintained sitting balance with close supervision. Lateral lean to R with assist to place slide board and pt performed transfer into wheelchair with min A for safety. Pt performed grooming tasks at sink and set up from breakfast at end of session. Call bell and all needed items within reach upon exiting the room.   Therapy Documentation Precautions:  Precautions Precautions: Back Precaution Comments: TLSO worn when OOB Required Braces or Orthoses: Spinal Brace Spinal Brace: Thoracolumbosacral orthotic, Applied in sitting position Restrictions Weight Bearing Restrictions:  No    Pain: Pain Assessment Pain Scale: 0-10 Pain Score: 0-No pain  Other Treatments:     Therapy/Group: Individual Therapy  Alen BleacherBradsher, Noheli Melder P 03/17/2018, 10:28 AM

## 2018-03-17 NOTE — Progress Notes (Signed)
[place] PHYSICAL MEDICINE & REHABILITATION PROGRESS NOTE   Subjective/Complaints: Getting up with OT, working on bowel program. No new complains.   ROS: Patient denies fever, rash, sore throat, blurred vision, nausea, vomiting, diarrhea, cough, shortness of breath or chest pain, joint or back pain, headache, or mood change.    Objective:   No results found. Recent Labs    03/17/18 0534  WBC 4.4  HGB 10.7*  HCT 33.8*  PLT 461*   Recent Labs    03/17/18 0534  NA 137  K 4.1  CL 102  CO2 26  GLUCOSE 107*  BUN 13  CREATININE 0.94  CALCIUM 8.9    Intake/Output Summary (Last 24 hours) at 03/17/2018 1444 Last data filed at 03/17/2018 0848 Gross per 24 hour  Intake 480 ml  Output 550 ml  Net -70 ml     Physical Exam: Vital Signs Blood pressure 138/89, pulse 84, temperature 97.6 F (36.4 C), resp. rate 19, weight 75 kg, SpO2 100 %. Constitutional: No distress . Vital signs reviewed. HEENT: EOMI, oral membranes moist Neck: supple Cardiovascular: RRR without murmur. No JVD    Respiratory: CTA Bilaterally without wheezes or rales. Normal effort    GI: BS +, non-tender, non-distended  Musculoskeletal: He exhibits edema. He exhibits no tenderness.  No edema or tenderness in extremities  Neurological: He is alert and oriented to person, place, and time.  Motor: Bilateral upper extremities: 5/5 proximal to distal Bilateral lower extremities: 0/5 proximal distal--no change Sensory diminished below T10, some residual at level of injury, 0/2 below. No resting tone Skin: Skin is warm and dry. He is not diaphoretic.  Back incision CDI with glue/tape Psychiatric: pleasant     Assessment/Plan: 1. Functional deficits secondary to thoracic spinal cord injury which require 3+ hours per day of interdisciplinary therapy in a comprehensive inpatient rehab setting.  Physiatrist is providing close team supervision and 24 hour management of active medical problems listed  below.  Physiatrist and rehab team continue to assess barriers to discharge/monitor patient progress toward functional and medical goals  Care Tool:  Bathing  Bathing activity did not occur: Refused Body parts bathed by patient: Right arm, Left arm, Chest, Abdomen, Right upper leg, Left upper leg, Right lower leg, Left lower leg, Face, Front perineal area, Buttocks   Body parts bathed by helper: Buttocks Body parts n/a: Front perineal area   Bathing assist Assist Level: Set up assist     Upper Body Dressing/Undressing Upper body dressing   What is the patient wearing?: Pull over shirt, Orthosis Orthosis activity level: Performed by helper  Upper body assist Assist Level: Minimal Assistance - Patient > 75%    Lower Body Dressing/Undressing Lower body dressing      What is the patient wearing?: Pants     Lower body assist Assist for lower body dressing: Set up assist     Toileting Toileting Toileting Activity did not occur (Clothing management and hygiene only): N/A (no void or bm)  Toileting assist Assist for toileting: Supervision/Verbal cueing     Transfers Chair/bed transfer  Transfers assist     Chair/bed transfer assist level: Minimal Assistance - Patient > 75%     Locomotion Ambulation   Ambulation assist   Ambulation activity did not occur: Safety/medical concerns          Walk 10 feet activity   Assist  Walk 10 feet activity did not occur: Safety/medical concerns        Walk 50 feet activity  Assist Walk 50 feet with 2 turns activity did not occur: Safety/medical concerns         Walk 150 feet activity   Assist Walk 150 feet activity did not occur: Safety/medical concerns         Walk 10 feet on uneven surface  activity   Assist Walk 10 feet on uneven surfaces activity did not occur: Safety/medical concerns         Wheelchair     Assist Will patient use wheelchair at discharge?: Yes Type of Wheelchair:  Manual    Wheelchair assist level: Supervision/Verbal cueing Max wheelchair distance: 600    Wheelchair 50 feet with 2 turns activity    Assist        Assist Level: Supervision/Verbal cueing   Wheelchair 150 feet activity     Assist     Assist Level: Supervision/Verbal cueing     Medical Problem List and Plan: 1.  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 2.  DVT Prophylaxis/Anticoagulation: Pharmaceutical: Lovenox bid x 3 months.   -dopplers were negative  3. Pain Management: oxycodone prn.  4. Mood: continue to provide positive reinforcement  5. Neuropsych: This patient is capable of making decisions on his own behalf. 6. Skin/Wound Care: routine pressure relief measures.  7. Fluids/Electrolytes/Nutrition: encourage PO    8. ABLA:  F/u hgb 11 9. Neurogenic bladder: I/O cath every 4-6 hours to keep volumes less than 350 cc.  follow volumes and patterns.   -appears to be having a lot of incontinence. Will d/w RN 10 Neurogenic bowel:    -continue daily suppository    -added fiber and probiotic and stool is becoming more formed.  -working toward AM program 11. Orthostatic symptoms?:   -continue  ACE/TEDs when up   abdominal binder if needed for hypotension.     LOS: 7 days A FACE TO FACE EVALUATION WAS PERFORMED  Ranelle OysterZachary T Swartz 03/17/2018, 2:44 PM

## 2018-03-17 NOTE — Progress Notes (Signed)
Physical Therapy Session Note  Patient Details  Name: Erik [name] MRN: [id number] Date of Birth: 04/16/1965  Today's Date: 03/17/2018 PT Individual Time: 1400-1455 PT Individual Time Calculation (min): 55 min   Short Term Goals: Week 1:  PT Short Term Goal 1 (Week 1): Pt will perform bed mobility with mod A consistently PT Short Term Goal 2 (Week 1): Pt will maintain sitting balance EOB with SBA consistently PT Short Term Goal 3 (Week 1): Pt will be able to recall pressure relief techniques  Skilled Therapeutic Interventions/Progress Updates:    Pt received seated in bed. Pt reports feeling nauseous and lightheaded for the past hour. RN aware and pt has received medication for nausea. Supine BP 145/96, HR 84. Pt also reports discomfort in abdominal region and that he has been able to void urine in urinal this date vs needing to be cathed. Education with patient about not leaving urinal in place for a significant period of time due to decrease sensation in peri-area to prevent skin breakdown. Pt agreeable. Pt declines any OOB mobility 2/2 feeling nauseous and dizzy. Agreeable to bed level stretching program. Supine BLE PROM in all available planes of motion to prevent contracture. BP 127/93, HR 81 at end of therapy session. Pt reports decrease in nausea throughout session. Pt left seated in bed with needs in reach, bed alarm in place.  Therapy Documentation Precautions:  Precautions Precautions: Back Precaution Comments: TLSO worn when OOB Required Braces or Orthoses: Spinal Brace Spinal Brace: Thoracolumbosacral orthotic, Applied in sitting position Restrictions Weight Bearing Restrictions: No   Therapy/Group: Individual Therapy  Peter Congoaylor Ruba Outen, PT, DPT  03/17/2018, 4:05 PM

## 2018-03-18 ENCOUNTER — Inpatient Hospital Stay (HOSPITAL_COMMUNITY): Payer: BLUE CROSS/BLUE SHIELD | Admitting: Physical Therapy

## 2018-03-18 ENCOUNTER — Inpatient Hospital Stay (HOSPITAL_COMMUNITY): Payer: BLUE CROSS/BLUE SHIELD | Admitting: Occupational Therapy

## 2018-03-18 NOTE — Progress Notes (Signed)
Physical Therapy Session Note  Patient Details  Name: Erik InchesGlen Senft MRN: [id number] Date of Birth: 1965/09/10  Today's Date: 03/18/2018 PT Individual Time: 1115-1205 PT Individual Time Calculation (min): 50 min   Short Term Goals: Week 1:  PT Short Term Goal 1 (Week 1): Pt will perform bed mobility with mod A consistently PT Short Term Goal 2 (Week 1): Pt will maintain sitting balance EOB with SBA consistently PT Short Term Goal 3 (Week 1): Pt will be able to recall pressure relief techniques  Skilled Therapeutic Interventions/Progress Updates:    Pt received seated in bed, agreeable to PT. Pt reports feeling much better than yesterday PM. No complaints of pain. Supine to sit with min A for trunk control. Sliding board transfer bed to w/c with min A. Manual w/c propulsion x 200 ft with BUE and Supervision to mod I. Sliding board transfer w/c to/from therapy mat x 4 reps with min A to various height surfaces (level, downhill, slightly uphill). Session focus on head/hips relationship during transfer with verbal cues, manual cues, and demonstration. Pt demos fair understanding and will need continued follow up with regards to head/hips during transfers. Pt has bowel incontinence in brief at end of therapy session. Sliding board transfer back to bed min A. Sit to supine mod A for BLE management. Rolling L/R with min A and use of bedrails. Dependent for brief change pericare for time management. Pt left semi-reclined in bed with needs in reach, bed alarm in place.  Therapy Documentation Precautions:  Precautions Precautions: Back Precaution Comments: TLSO worn when OOB Required Braces or Orthoses: Spinal Brace Spinal Brace: Thoracolumbosacral orthotic, Applied in sitting position Restrictions Weight Bearing Restrictions: No   Therapy/Group: Individual Therapy  Peter Congoaylor Iyani Dresner, PT, DPT  03/18/2018, 2:06 PM

## 2018-03-18 NOTE — Progress Notes (Addendum)
[place] PHYSICAL MEDICINE & REHABILITATION PROGRESS NOTE   Subjective/Complaints: Getting up with OT, working on bowel program. No new complains.   ROS: Patient denies fever, rash, sore throat, blurred vision, nausea, vomiting, diarrhea, cough, shortness of breath or chest pain, joint or back pain, headache, or mood change.    Objective:   No results found. Recent Labs    03/17/18 0534  WBC 4.4  HGB 10.7*  HCT 33.8*  PLT 461*   Recent Labs    03/17/18 0534  NA 137  K 4.1  CL 102  CO2 26  GLUCOSE 107*  BUN 13  CREATININE 0.94  CALCIUM 8.9    Intake/Output Summary (Last 24 hours) at 03/18/2018 1053 Last data filed at 03/18/2018 0856 Gross per 24 hour  Intake 564 ml  Output 700 ml  Net -136 ml     Physical Exam: Vital Signs Blood pressure 124/84, pulse 89, temperature 97.8 F (36.6 C), resp. rate 18, weight 75 kg, SpO2 99 %. Constitutional: No distress . Vital signs reviewed. HEENT: EOMI, oral membranes moist Neck: supple Cardiovascular: RRR without murmur. No JVD    Respiratory: CTA Bilaterally without wheezes or rales. Normal effort    GI: BS +, non-tender, non-distended  Musculoskeletal: He exhibits edema. He exhibits no tenderness.  No edema or tenderness in extremities  Neurological: He is alert and oriented to person, place, and time.  Motor: Bilateral upper extremities: 5/5 proximal to distal Bilateral lower extremities: 0/5 proximal distal--no change Sensory diminished below T10, some residual at level of injury, 0/2 below. No resting tone Skin: Skin is warm and dry. He is not diaphoretic.  Back incision CDI with glue/tape Psychiatric: pleasant     Assessment/Plan: 1. Functional deficits secondary to thoracic spinal cord injury which require 3+ hours per day of interdisciplinary therapy in a comprehensive inpatient rehab setting.  Physiatrist is providing close team supervision and 24 hour management of active medical problems listed  below.  Physiatrist and rehab team continue to assess barriers to discharge/monitor patient progress toward functional and medical goals  Care Tool:  Bathing  Bathing activity did not occur: Refused Body parts bathed by patient: Right arm, Left arm, Chest, Abdomen, Right upper leg, Left upper leg, Right lower leg, Left lower leg, Face, Front perineal area, Buttocks   Body parts bathed by helper: Buttocks Body parts n/a: Front perineal area   Bathing assist Assist Level: Set up assist     Upper Body Dressing/Undressing Upper body dressing   What is the patient wearing?: Pull over shirt Orthosis activity level: Performed by helper  Upper body assist Assist Level: Set up assist    Lower Body Dressing/Undressing Lower body dressing      What is the patient wearing?: Pants     Lower body assist Assist for lower body dressing: Set up assist     Toileting Toileting Toileting Activity did not occur (Clothing management and hygiene only): N/A (no void or bm)  Toileting assist Assist for toileting: Supervision/Verbal cueing     Transfers Chair/bed transfer  Transfers assist     Chair/bed transfer assist level: Minimal Assistance - Patient > 75%     Locomotion Ambulation   Ambulation assist   Ambulation activity did not occur: Safety/medical concerns          Walk 10 feet activity   Assist  Walk 10 feet activity did not occur: Safety/medical concerns        Walk 50 feet activity   Assist Walk  50 feet with 2 turns activity did not occur: Safety/medical concerns         Walk 150 feet activity   Assist Walk 150 feet activity did not occur: Safety/medical concerns         Walk 10 feet on uneven surface  activity   Assist Walk 10 feet on uneven surfaces activity did not occur: Safety/medical concerns         Wheelchair     Assist Will patient use wheelchair at discharge?: Yes Type of Wheelchair: Manual    Wheelchair assist  level: Supervision/Verbal cueing Max wheelchair distance: 600    Wheelchair 50 feet with 2 turns activity    Assist        Assist Level: Supervision/Verbal cueing   Wheelchair 150 feet activity     Assist     Assist Level: Supervision/Verbal cueing     Medical Problem List and Plan: 1.  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 2.  DVT Prophylaxis/Anticoagulation: Pharmaceutical: Lovenox bid x 3 months.   -dopplers were negative  3. Pain Management: oxycodone prn.  4. Mood: continue to provide positive reinforcement  5. Neuropsych: This patient is capable of making decisions on his own behalf. 6. Skin/Wound Care: routine pressure relief measures.  7. Fluids/Electrolytes/Nutrition: encourage PO     -I personally reviewed the patient's labs today.   8. ABLA:  F/u hgb 10.7 12/16 9. Neurogenic bladder: I/O cath every 4-6 hours to keep volumes less than 350 cc.  follow volumes and patterns.   -appears to be having a lot of incontinence.  -urine now with odor  -discussed with patient that we may need to relax his bladder  -first will check ua, ucx 10 Neurogenic bowel:    -continue daily suppository    -added fiber and probiotic and stool is becoming more formed.  -working toward AM program---good results today 11. Orthostatic symptoms?:   -continue  ACE/TEDs when up   abdominal binder if needed for hypotension.     LOS: 8 days A FACE TO FACE EVALUATION WAS PERFORMED  Ranelle Oyster 03/18/2018, 10:53 AM

## 2018-03-18 NOTE — Progress Notes (Addendum)
Physical Therapy Weekly Progress Note  Patient Details  Name: Erik [name] MRN: [id number] Date of Birth: May 18, 1965  Beginning of progress report period: March 11, 2018 End of progress report period: March 18, 2018  Today's Date: 03/18/2018  Patient has met 2 of 3 short term goals.  Pt exhibits good motivation and participation in therapy sessions and is very receptive to all education provided up to this point. Pt is making good gains with therapy and is currently mod A for bed mobility, min to mod A for sliding board transfers, Supervision to mod I for w/c level mobility. Pt has been provided with a home stretching program and has been educated on pressure relief techniques, will continue with education as patient progresses.  Patient continues to demonstrate the following deficits muscle weakness and muscle paralysis, abnormal tone, unbalanced muscle activation and decreased coordination and decreased sitting balance, decreased postural control and decreased balance strategies and therefore will continue to benefit from skilled PT intervention to increase functional independence with mobility.  Patient progressing toward long term goals..  Continue plan of care.  PT Short Term Goals Week 1:  PT Short Term Goal 1 (Week 1): Pt will perform bed mobility with mod A consistently PT Short Term Goal 1 - Progress (Week 1): Met PT Short Term Goal 2 (Week 1): Pt will maintain sitting balance EOB with SBA consistently PT Short Term Goal 2 - Progress (Week 1): Progressing toward goal PT Short Term Goal 3 (Week 1): Pt will be able to recall pressure relief techniques PT Short Term Goal 3 - Progress (Week 1): Met Week 2:  PT Short Term Goal 1 (Week 2): Pt will perform bed mobility with min A consistently PT Short Term Goal 2 (Week 2): Pt will perform least restrictive transfer with min A consistently PT Short Term Goal 3 (Week 2): Pt will maintain sitting balance EOB with SBA  consistently  Skilled Therapeutic Interventions/Progress Updates:  Balance/vestibular training;Community reintegration;Discharge planning;Disease management/prevention;DME/adaptive equipment instruction;Functional mobility training;Neuromuscular re-education;Pain management;Patient/family education;Psychosocial support;Skin care/wound management;Therapeutic Activities;Therapeutic Exercise;UE/LE Strength taining/ROM;UE/LE Coordination activities;Wheelchair propulsion/positioning   Therapy Documentation Precautions:  Precautions Precautions: Back Precaution Comments: TLSO worn when OOB Required Braces or Orthoses: Spinal Brace Spinal Brace: Thoracolumbosacral orthotic, Applied in sitting position Restrictions Weight Bearing Restrictions: No   Therapy/Group: Individual Therapy   Excell Seltzer, PT, DPT 03/18/2018, 3:55 PM

## 2018-03-18 NOTE — Plan of Care (Signed)
  Problem: Consults Goal: RH SPINAL CORD INJURY PATIENT EDUCATION Description  See Patient Education module for education specifics.  Outcome: Progressing   Problem: SCI BOWEL ELIMINATION Goal: RH STG MANAGE BOWEL WITH ASSISTANCE Description STG Manage Bowel with  Min Assistance.  Outcome: Progressing Goal: RH STG SCI MANAGE BOWEL WITH MEDICATION WITH ASSISTANCE Description STG SCI Manage bowel with medication with Min assistance.  Outcome: Progressing Goal: RH STG MANAGE BOWEL W/EQUIPMENT W/ASSISTANCE Description STG Manage Bowel With Equipment With Min Assistance  Outcome: Progressing Goal: RH STG SCI MANAGE BOWEL PROGRAM W/ASSIST OR AS APPROPRIATE Description STG SCI Manage bowel program w/min assist or as appropriate.  Outcome: Progressing   Problem: SCI BLADDER ELIMINATION Goal: RH STG MANAGE BLADDER WITH ASSISTANCE Description STG Manage Bladder With Min Assistance  Outcome: Progressing Goal: RH STG MANAGE BLADDER WITH EQUIPMENT WITH ASSISTANCE Description STG Manage Bladder With Equipment With min  Assistance  Outcome: Progressing Goal: RH STG SCI MANAGE BLADDER PROGRAM W/ASSISTANCE Description Pt will be Mod I with equipment and be able to manage bladder with I/O  cath   Outcome: Progressing   Problem: RH SKIN INTEGRITY Goal: RH STG SKIN FREE OF INFECTION/BREAKDOWN Description Pt will remain free from skin breakdown while in rehab with min assist  Outcome: Progressing Goal: RH STG MAINTAIN SKIN INTEGRITY WITH ASSISTANCE Description STG Maintain Skin Integrity With min Assistance.  Outcome: Progressing   Problem: RH SAFETY Goal: RH STG ADHERE TO SAFETY PRECAUTIONS W/ASSISTANCE/DEVICE Description STG Adhere to Safety Precautions With min Assistance/Device.  Outcome: Progressing   Problem: RH PAIN MANAGEMENT Goal: RH STG PAIN MANAGED AT OR BELOW PT'S PAIN GOAL Description Pain will be managed at a goal set of less than 3 out 10 with PRN pain med with mod  I assist  Outcome: Progressing   Problem: RH KNOWLEDGE DEFICIT SCI Goal: RH STG INCREASE KNOWLEDGE OF SELF CARE AFTER SCI Description Pt will be able to verbalize self care with Mod I assist  Outcome: Progressing   

## 2018-03-18 NOTE — Progress Notes (Signed)
Occupational Therapy Session Note  Patient Details  Name: Phineas InchesGlen Angello MRN: [id number] Date of Birth: 12/11/1965  Today's Date: 03/18/2018 OT Individual Time: [id number] OT Individual Time Calculation (min): 76 min  and Today's Date: 03/18/2018 OT Missed Time: 10 Minutes Missed Time Reason: Other (comment)(bedpan)   Short Term Goals: Week 1:  OT Short Term Goal 1 (Week 1): Pt will perform LB dressing with mod A for sitting balance with task.  OT Short Term Goal 2 (Week 1): Pt will perform mod A transfer onto commode/ drop arm commode chair.  OT Short Term Goal 3 (Week 1): Pt will perform bathing with mod A overall to decreased A with self care.  Skilled Therapeutic Interventions/Progress Updates:    Upon entering the room, pt supine in bed and on bedpan from bowel program. Pt with no c/o pain this session. RN arriving to check pt and he had not had BM at this time and bed pan removed. OT provided pt with set up A for bathing from bed level with pt seated in long sitting position with assistance to wash back only. Use of circle sitting to reach feet and LE. OT provided total A to don B TED hose. Pt reports feeling like he has had BM and rolls to the L with min A and use of bed rail. Pt provided with set up A and able to perform hygiene himself. However, after performing hygiene, pt began having another BM. Pt placed back onto bed pan and RN notified. Call bell within reach as OT exited the room.   Therapy Documentation Precautions:  Precautions Precautions: Back Precaution Comments: TLSO worn when OOB Required Braces or Orthoses: Spinal Brace Spinal Brace: Thoracolumbosacral orthotic, Applied in sitting position Restrictions Weight Bearing Restrictions: No General: General OT Amount of Missed Time: 10 Minutes Vital Signs: Therapy Vitals Temp: 97.8 F (36.6 C) Pulse Rate: 89 Resp: 18 BP: 124/84 Patient Position (if appropriate): Lying Oxygen Therapy SpO2: 99 % O2 Device: Room  Air   Therapy/Group: Individual Therapy  Alen BleacherBradsher, Hernando Reali P 03/18/2018, 8:24 AM

## 2018-03-18 NOTE — Progress Notes (Signed)
Physical Therapy Session Note  Patient Details  Name: Erik InchesGlen Egler MRN: [id number] Date of Birth: Aug 27, 1965  Today's Date: 03/18/2018 PT Individual Time: 1515-1610 PT Individual Time Calculation (min): 55 min   Short Term Goals: Week 2:  PT Short Term Goal 1 (Week 2): Pt will perform bed mobility with min A consistently PT Short Term Goal 2 (Week 2): Pt will perform least restrictive transfer with min A consistently PT Short Term Goal 3 (Week 2): Pt will maintain sitting balance EOB with SBA consistently  Skilled Therapeutic Interventions/Progress Updates:   Pt in w/c and agreeable to therapy, denies pain. Self propelled w/c to/from therapy gym. Slide board transfer to/from edge of mat w/ min assist. Assist needed to only lift leg while pt placed board and therapist braced board for safety during lateral scooting. Worked on static sitting balance and activation of abdominal musculature this session. Sit<>propped sidelying w/ supervision and tactile cues for TA activation 2x5 reps each side. Transitioned to supine w/ legs propped to decrease pressure on back. Educated pt on how to activate TA as he had increased difficulty in sitting unsupported. Tactile and verbal cues for activation, posterior pelvic tilt, and breathing. Pt w/ good activation of TA in this position, also performed once back to sitting in w/c w/ improved technique. Educated pt on performing in bed and in w/c in between therapy sessions, provided w/ visual and written handout w/ frequency. Pt appreciative of learning new exercise he can perform by himself. Returned to room and ended session in w/c, all needs in reach.    Therapy Documentation Precautions:  Precautions Precautions: Back Precaution Comments: TLSO worn when OOB Required Braces or Orthoses: Spinal Brace Spinal Brace: Thoracolumbosacral orthotic, Applied in sitting position Restrictions Weight Bearing Restrictions: No Vital Signs: Therapy Vitals Temp: 98.2 F  (36.8 C) Pulse Rate: 90 Resp: 17 BP: 117/87 Patient Position (if appropriate): Sitting Oxygen Therapy SpO2: 100 % O2 Device: Room Air  Therapy/Group: Individual Therapy  [place] Goeken K Suda Forbess 03/18/2018, 4:09 PM

## 2018-03-19 ENCOUNTER — Inpatient Hospital Stay (HOSPITAL_COMMUNITY): Payer: BLUE CROSS/BLUE SHIELD | Admitting: Occupational Therapy

## 2018-03-19 ENCOUNTER — Inpatient Hospital Stay (HOSPITAL_COMMUNITY): Payer: BLUE CROSS/BLUE SHIELD | Admitting: Physical Therapy

## 2018-03-19 DIAGNOSIS — N390 Urinary tract infection, site not specified: Secondary | ICD-10-CM

## 2018-03-19 DIAGNOSIS — A499 Bacterial infection, unspecified: Secondary | ICD-10-CM

## 2018-03-19 LAB — URINALYSIS, COMPLETE (UACMP) WITH MICROSCOPIC
Bilirubin Urine: NEGATIVE
Glucose, UA: NEGATIVE mg/dL
Ketones, ur: NEGATIVE mg/dL
Nitrite: POSITIVE — AB
Protein, ur: NEGATIVE mg/dL
Specific Gravity, Urine: 1.009 (ref 1.005–1.030)
WBC, UA: >50 WBC/hpf — ABNORMAL HIGH (ref 0–5)
pH: 6 (ref 5.0–8.0)

## 2018-03-19 MED ORDER — CEPHALEXIN 250 MG PO CAPS
250.0000 mg | ORAL_CAPSULE | Freq: Three times a day (TID) | ORAL | Status: DC
  Administered 2018-03-19 – 2018-03-25 (×17): 250 mg via ORAL
  Filled 2018-03-19 (×17): qty 1

## 2018-03-19 NOTE — Progress Notes (Signed)
Physical Therapy Session Note  Patient Details  Name: Erik [name] MRN: [id number] Date of Birth: [birth year]  Today's Date: 03/19/2018 PT Individual Time: 1115-1200; 1500-1530 PT Individual Time Calculation (min): 45 min and 30 min  Short Term Goals: Week 2:  PT Short Term Goal 1 (Week 2): Pt will perform bed mobility with min A consistently PT Short Term Goal 2 (Week 2): Pt will perform least restrictive transfer with min A consistently PT Short Term Goal 3 (Week 2): Pt will maintain sitting balance EOB with SBA consistently  Skilled Therapeutic Interventions/Progress Updates:    Session 1: Pt received seated in bed, agreeable to PT. Pt reports some pain in R rib area, not rated. Set pt up with BLE leg loops/lifters. Pt has incontinence of bowel while seated in bed. Rolling L/R with min A and use of bedrail for max A pericare and brief change. Pt has incontinence of urine while pericare being performed, max A for cleanup and donning of new brief. Supine to sit with mod A. SBA for sitting balance EOB with BUE support. Dependent to don TLSO while seated EOB. Sliding board transfer bed to w/c with min A, v/c for head/hips relationship. Pt left seated in w/c in room with needs in reach, set up for lunch.  Session 2: Pt received seated in w/c in room, agreeable to PT. No complaints of pain. Manual w/c propulsion x 150 ft with BUE and Supervision. Pt is Supervision for management of w/c parts prior to transfer. Sliding board transfer w/c to mat table with min A, cues for anterior weight shift to unweight buttocks. Sitting balance EOM with SBA to CGA while progressing to no UE support and reaching outside BOS. Ball pass from lap around backside and back to the front with CGA to min A for sitting balance, occasional LOB that pt is able to correct by letting go of ball and use of BUE support, pt is able to complete 2 passes with CGA for balance with no LOB. Practice maneuvering BLE while seated EOM with  use of leg lifters, min A for sitting balance. Sliding board transfer back to w/c with min A. Pt left seated in w/c in therapy apartment handed off to OT for next session.  Therapy Documentation Precautions:  Precautions Precautions: Back Precaution Comments: TLSO worn when OOB Required Braces or Orthoses: Spinal Brace Spinal Brace: Thoracolumbosacral orthotic, Applied in sitting position Restrictions Weight Bearing Restrictions: No    Therapy/Group: Individual Therapy   Peter Congoaylor Jacalyn Biggs, PT, DPT  03/19/2018, 2:50 PM

## 2018-03-19 NOTE — Progress Notes (Signed)
Education started with patient on I/o cath to self. Patient able to recall how to cath self, clean process and proper hand hygiene.

## 2018-03-19 NOTE — Progress Notes (Signed)
[place] PHYSICAL MEDICINE & REHABILITATION PROGRESS NOTE   Subjective/Complaints: Patient up in bed.  A little sore in his torso but otherwise feeling well.  Remains motivated.  ROS: Patient denies fever, rash, sore throat, blurred vision, nausea, vomiting, diarrhea, cough, shortness of breath or chest pain, joint or back pain, headache, or mood change.    Objective:   No results found. Recent Labs    03/17/18 0534  WBC 4.4  HGB 10.7*  HCT 33.8*  PLT 461*   Recent Labs    03/17/18 0534  NA 137  K 4.1  CL 102  CO2 26  GLUCOSE 107*  BUN 13  CREATININE 0.94  CALCIUM 8.9    Intake/Output Summary (Last 24 hours) at 03/19/2018 1659 Last data filed at 03/19/2018 0829 Gross per 24 hour  Intake 462 ml  Output 750 ml  Net -288 ml     Physical Exam: Vital Signs Blood pressure 117/83, pulse 84, temperature 98.3 F (36.8 C), temperature source Oral, resp. rate 14, weight 75 kg, SpO2 100 %. Constitutional: No distress . Vital signs reviewed. HEENT: EOMI, oral membranes moist Neck: supple Cardiovascular: RRR without murmur. No JVD    Respiratory: CTA Bilaterally without wheezes or rales. Normal effort    GI: BS +, non-tender, non-distended  Musculoskeletal: He exhibits edema. He exhibits no tenderness.  No edema or tenderness in extremities  Neurological: He is alert and oriented to person, place, and time.  Motor: Bilateral upper extremities: 5/5 proximal to distal Bilateral lower extremities: 0/5 proximal distal--no motor changes in the lower extremities Sensory diminished below T10, some residual at level of injury, 0/2 below. No resting tone Skin: Skin is warm and dry. He is not diaphoretic.  Back incision CDI with glue/tape in place Psychiatric: pleasant     Assessment/Plan: 1. Functional deficits secondary to thoracic spinal cord injury which require 3+ hours per day of interdisciplinary therapy in a comprehensive inpatient rehab setting.  Physiatrist is  providing close team supervision and 24 hour management of active medical problems listed below.  Physiatrist and rehab team continue to assess barriers to discharge/monitor patient progress toward functional and medical goals  Care Tool:  Bathing  Bathing activity did not occur: Refused Body parts bathed by patient: Right arm, Left arm, Chest, Abdomen, Right upper leg, Left upper leg, Right lower leg, Left lower leg, Face, Front perineal area, Buttocks   Body parts bathed by helper: Buttocks Body parts n/a: Front perineal area   Bathing assist Assist Level: Set up assist     Upper Body Dressing/Undressing Upper body dressing   What is the patient wearing?: Pull over shirt Orthosis activity level: Performed by helper  Upper body assist Assist Level: Set up assist    Lower Body Dressing/Undressing Lower body dressing      What is the patient wearing?: Pants     Lower body assist Assist for lower body dressing: Set up assist     Toileting Toileting Toileting Activity did not occur (Clothing management and hygiene only): N/A (no void or bm)  Toileting assist Assist for toileting: Supervision/Verbal cueing     Transfers Chair/bed transfer  Transfers assist     Chair/bed transfer assist level: Minimal Assistance - Patient > 75%     Locomotion Ambulation   Ambulation assist   Ambulation activity did not occur: Safety/medical concerns          Walk 10 feet activity   Assist  Walk 10 feet activity did not occur: Safety/medical  concerns        Walk 50 feet activity   Assist Walk 50 feet with 2 turns activity did not occur: Safety/medical concerns         Walk 150 feet activity   Assist Walk 150 feet activity did not occur: Safety/medical concerns         Walk 10 feet on uneven surface  activity   Assist Walk 10 feet on uneven surfaces activity did not occur: Safety/medical concerns         Wheelchair     Assist Will patient  use wheelchair at discharge?: Yes Type of Wheelchair: Manual    Wheelchair assist level: Supervision/Verbal cueing Max wheelchair distance: 200'    Wheelchair 50 feet with 2 turns activity    Assist        Assist Level: Supervision/Verbal cueing   Wheelchair 150 feet activity     Assist     Assist Level: Supervision/Verbal cueing     Medical Problem List and Plan: 1.  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 2.  DVT Prophylaxis/Anticoagulation: Pharmaceutical: Lovenox bid x 3 months.   -dopplers were negative  3. Pain Management: oxycodone prn.  4. Mood: continue to provide positive reinforcement  5. Neuropsych: This patient is capable of making decisions on his own behalf. 6. Skin/Wound Care: routine pressure relief measures.  7. Fluids/Electrolytes/Nutrition: encourage PO         8. ABLA:  F/u hgb 10.7 12/16 9. Neurogenic bladder: I/O cath every 4-6 hours to keep volumes less than 350 cc.  follow volumes and patterns.   -appears to be having a lot of incontinence.  -urine now with odor  -discussed with patient that we may need to relax his bladder  -Urinalysis positive, urine culture still pending.  We will begin empiric Keflex 10 Neurogenic bowel:    -continue daily suppository    -added fiber and probiotic and stool is becoming more formed.  -working toward AM program--- improving results 11. Orthostatic symptoms?:   -continue  ACE/TEDs when up   abdominal binder if needed for hypotension.     LOS: 9 days A FACE TO FACE EVALUATION WAS PERFORMED  Ranelle Oyster 03/19/2018, 4:59 PM

## 2018-03-19 NOTE — Plan of Care (Signed)
  Problem: Consults Goal: RH SPINAL CORD INJURY PATIENT EDUCATION Description  See Patient Education module for education specifics.  Outcome: Progressing   Problem: SCI BOWEL ELIMINATION Goal: RH STG MANAGE BOWEL WITH ASSISTANCE Description STG Manage Bowel with  Min Assistance.  Outcome: Progressing Goal: RH STG SCI MANAGE BOWEL WITH MEDICATION WITH ASSISTANCE Description STG SCI Manage bowel with medication with Min assistance.  Outcome: Progressing Goal: RH STG MANAGE BOWEL W/EQUIPMENT W/ASSISTANCE Description STG Manage Bowel With Equipment With Min Assistance  Outcome: Progressing Goal: RH STG SCI MANAGE BOWEL PROGRAM W/ASSIST OR AS APPROPRIATE Description STG SCI Manage bowel program w/min assist or as appropriate.  Outcome: Progressing   Problem: SCI BLADDER ELIMINATION Goal: RH STG MANAGE BLADDER WITH ASSISTANCE Description STG Manage Bladder With Min Assistance  Outcome: Progressing Goal: RH STG MANAGE BLADDER WITH EQUIPMENT WITH ASSISTANCE Description STG Manage Bladder With Equipment With min  Assistance  Outcome: Progressing Goal: RH STG SCI MANAGE BLADDER PROGRAM W/ASSISTANCE Description Pt will be Mod I with equipment and be able to manage bladder with I/O  cath   Outcome: Progressing   Problem: RH SKIN INTEGRITY Goal: RH STG SKIN FREE OF INFECTION/BREAKDOWN Description Pt will remain free from skin breakdown while in rehab with min assist  Outcome: Progressing Goal: RH STG MAINTAIN SKIN INTEGRITY WITH ASSISTANCE Description STG Maintain Skin Integrity With min Assistance.  Outcome: Progressing   Problem: RH SAFETY Goal: RH STG ADHERE TO SAFETY PRECAUTIONS W/ASSISTANCE/DEVICE Description STG Adhere to Safety Precautions With min Assistance/Device.  Outcome: Progressing   Problem: RH PAIN MANAGEMENT Goal: RH STG PAIN MANAGED AT OR BELOW PT'S PAIN GOAL Description Pain will be managed at a goal set of less than 3 out 10 with PRN pain med with mod  I assist  Outcome: Progressing   Problem: RH KNOWLEDGE DEFICIT SCI Goal: RH STG INCREASE KNOWLEDGE OF SELF CARE AFTER SCI Description Pt will be able to verbalize self care with Mod I assist  Outcome: Progressing   

## 2018-03-19 NOTE — Patient Care Conference (Signed)
Inpatient RehabilitationTeam Conference and Plan of Care Update Date: 03/18/2018   Time: 2:20 PM    Patient Name: Erik [name]      Medical Record Number: [id number]  Date of Birth: 1966/03/08 Sex: Male         Room/Bed: 4W16C/4W16C-01 Payor Info: Payor: BLUE [employer]UE [employer] / Plan: BCBS OTHER / Product Type: *No Product type* /    Admitting Diagnosis: Paraplegia  Admit Date/Time:  03/10/2018  1:23 PM Admission Comments: No comment available   Primary Diagnosis:  Closed fracture of T11 vertebra with spinal cord injury (HCC) Principal Problem: Closed fracture of T11 vertebra with spinal cord injury [facility](HCC)  Patient Active Problem List   Diagnosis Date Noted  . Closed fracture of T11 vertebra with spinal cord injury (HCC) 03/10/2018  . Acute blood loss anemia   . Orthostasis   . Neurogenic bladder   . Neurogenic bowel   . Paraplegia [facility](HCC)     Expected Discharge Date: Expected Discharge Date: 04/08/18  Team Members Present: Physician leading conference: Dr. Faith RogueZachary Swartz Social Worker Present: Amada JupiterLucy Quintina Hakeem, LCSW Nurse Present: Chana Bodeeborah Sharp, RN PT Present: Other (comment)(Taylor Serita Griturkalo, PT) OT Present: Jackquline DenmarkKatie Bradsher, OT SLP Present: Colin BentonMadison Cratch, SLP PPS Coordinator present : Tora DuckMarie Noel, RN, CRRN;Melissa Greta DoomBowie     Current Status/Progress Goal Weekly Team Focus  Medical   Patient remains very motivated and making gains from a functional standpoint.  Still working towards bowel and bladder program which are somewhat inconsistent still.  Patient with a lot of by and  Maximize bowel and bladder program efficiency  See above   Bowel/Bladder   i&o cath q4-6 hours/ bowel program in morning.   patient to progress to urinate without need to cath.        Swallow/Nutrition/ Hydration             ADL's   min A rolling, use of long/circle sitting to don LB clothing from bed level with min guard, UB with set up A, bathing with set up A from bed level, min slide board  mod I overall  from wheelchair level with use of slide board for functional transfers  functional transfers, ADL retraining, balance, pt/family education   Mobility   min A rolling, max A supine to/from sit, min A SB transfer, mod I w/c mobility  Mod I with all transfers and w/c mobility  bed mobility, transfers, sitting balance, long-sitting, stretches, [employer]/support/administrative officeredu   Communication             Safety/Cognition/ Behavioral Observations            Pain   pain controlled with current medications   pain to remain controlled with current regamine       Skin   no skin impairments noted   n/a       Rehab Goals Patient on target to meet rehab goals: Yes *See Care Plan and progress notes for long and short-term goals.     Barriers to Discharge  Current Status/Progress Possible Resolutions Date Resolved   Physician    Medical stability        See medical problem list      Nursing                  PT                    OT  SLP                SW                Discharge Planning/Teaching Needs:  Home with girlfriend and daughter as primary supports.  May not be able to provide 24/7 support - plans still TBD.  family education on i/o cath    Team Discussion:  Continue to work on b/b program;  Doing well medically.  Min-mod assist bed mobility and min b/d.  Min slide board transfer.  Working really hard and very motivated.  Supervision to mod ind goals expected overall.  Revisions to Treatment Plan:  NA    Continued Need for Acute Rehabilitation Level of Care: The patient requires daily medical management by a physician with specialized training in physical medicine and rehabilitation for the following conditions: Daily direction of a multidisciplinary physical rehabilitation program to ensure safe treatment while eliciting the highest outcome that is of practical value to the patient.: Yes Daily medical management of patient stability for increased activity during participation in an  intensive rehabilitation regime.: Yes Daily analysis of laboratory values and/or radiology reports with any subsequent need for medication adjustment of medical intervention for : Post surgical problems;Neurological problems;Urological problems   I attest that I was present, lead the team conference, and concur with the assessment and plan of the team.   Tashon Capp 03/19/2018, 1:27 PM

## 2018-03-19 NOTE — Progress Notes (Signed)
Occupational Therapy Weekly Progress Note  Patient Details  Name: Eshawn Coor MRN: [id number] Date of Birth: [birth year]  Beginning of progress report period: March 11, 2018 End of progress report period: March 19, 2018  Today's Date: 03/19/2018 OT Individual Time: 0700-0825 and 1530-1600 OT Individual Time Calculation (min): 85 min and 30 min    Patient has met 3 of 3 short term goals. Pt making steady progress towards occupational therapy goals this week. Pt is very motivated for OT intervention. Pt is currently min - mod A for rolling and supine <> sit. Pt utilizing hospital bed with [facility] elevated for support with long sitting position at times secondary to fatigue with position. Pt has been bathing and dressing self from bed level with min - mod A for sitting balance. Focus on balance with self care tasks without use of bed functions of bedrails. Pt making excellent gains towards increased independence and feeling more confident each day. Min A slide board transfers.    Patient continues to demonstrate the following deficits: muscle weakness and muscle paralysis, decreased cardiorespiratoy endurance, abnormal tone and decreased coordination and decreased sitting balance, decreased standing balance and decreased postural control and therefore will continue to benefit from skilled OT intervention to enhance overall performance with BADL.  Patient progressing toward long term goals..  Continue plan of care.  OT Short Term Goals Week 1:  OT Short Term Goal 1 (Week 1): Pt will perform LB dressing with mod A for sitting balance with task.  OT Short Term Goal 1 - Progress (Week 1): Met OT Short Term Goal 2 (Week 1): Pt will perform mod A transfer onto commode/ drop arm commode chair.  OT Short Term Goal 2 - Progress (Week 1): Met OT Short Term Goal 3 (Week 1): Pt will perform bathing with mod A overall to decreased A with self care. OT Short Term Goal 3 - Progress (Week 1): Met Week 2:   OT Short Term Goal 1 (Week 2): Pt will perform toileting with mod A on BSC. OT Short Term Goal 2 (Week 2): Pt will come into long sitting position from flat surface without use of bed rails and min guard for balance.  OT Short Term Goal 3 (Week 2): Pt will utilize circle sitting and long sitting positions with close supervision for balance while performing LB self care without use of bedrail.  Skilled Therapeutic Interventions/Progress Updates:    Session 1:  Upon entering the room, pt supine in bed with no c/o pain. Pt seated agreeable to OT intervention and reports bowel program started at 530 am but "nothing happened". Pt agreeable to bathing and dressing tasks from bed level with focus on increased independence and long sitting/circle sitting techniques. Pt pulling LEs into circle sitting position to inspect LEs,wash, and put on lotion. Pt rolling L with mod A for LEs and began having BM. Pt placed pt on bed pan and continued UB self care. Pt did not have formed BM but produced mucus. RN notified. Pt performed hygiene while in side lying position with set up. Pt threading pants onto B feet and pulling over B hips with supervision. Pt requesting to remain in bed as he felt like he may need bed pan again. Call bell and all needed items within reach upon exiting the room.   Session 2: Pt transitioned from PT session without issue. Skilled OT intervention with focus on functional uneven transfers with use of slide board. Pt set up wheelchair with min verbal guidance  cuing for sequencing but pt performing all tasks himself. Pt attempting to utilize leg loops to cross R LE over L in order to place board but needed assistance with task. Slide board down into soft sofa, similar to home environment, with min A for balance. Pt needing mod cuing for set up but able to power up and lift bottom to clear wheelchair wheel and transfer from sofa onto wheelchair with min A as well. Pt very excited and reports, " I did  that better than I thought I could". OT discussing several other home set up concerns as pt propelled wheelchair back to room with supervision. Pt remained in chair with daughter present in room and call bell within reach.   Therapy Documentation Precautions:  Precautions Precautions: Back Precaution Comments: TLSO worn when OOB Required Braces or Orthoses: Spinal Brace Spinal Brace: Thoracolumbosacral orthotic, Applied in sitting position Restrictions Weight Bearing Restrictions: No Vital Signs: Therapy Vitals Pulse Rate: 84 Resp: 14 BP: 117/83 Patient Position (if appropriate): Sitting Oxygen Therapy SpO2: 100 % O2 Device: Room Air   Therapy/Group: Individual Therapy  Gypsy Decant 03/19/2018, 4:49 PM

## 2018-03-20 ENCOUNTER — Inpatient Hospital Stay (HOSPITAL_COMMUNITY): Payer: BLUE CROSS/BLUE SHIELD

## 2018-03-20 ENCOUNTER — Inpatient Hospital Stay (HOSPITAL_COMMUNITY): Payer: BLUE CROSS/BLUE SHIELD | Admitting: Occupational Therapy

## 2018-03-20 ENCOUNTER — Inpatient Hospital Stay (HOSPITAL_COMMUNITY): Payer: BLUE CROSS/BLUE SHIELD | Admitting: Physical Therapy

## 2018-03-20 MED ORDER — SENNOSIDES-DOCUSATE SODIUM 8.6-50 MG PO TABS
2.0000 | ORAL_TABLET | Freq: Every day | ORAL | Status: DC
  Administered 2018-03-20 – 2018-04-03 (×15): 2 via ORAL
  Filled 2018-03-20 (×17): qty 2

## 2018-03-20 NOTE — Progress Notes (Signed)
Occupational Therapy Session Note  Patient Details  Name: Erik [name] MRN: [id number] Date of Birth: 1965/07/03  Today's Date: 03/20/2018 OT Individual Time: 6815-9470 OT Individual Time Calculation (min): 55 min    Short Term Goals: Week 2:  OT Short Term Goal 1 (Week 2): Pt will perform toileting with mod A on BSC. OT Short Term Goal 2 (Week 2): Pt will come into long sitting position from flat surface without use of bed rails and min guard for balance.  OT Short Term Goal 3 (Week 2): Pt will utilize circle sitting and long sitting positions with close supervision for balance while performing LB self care without use of bedrail.  Skilled Therapeutic Interventions/Progress Updates:    Pt received supine in bed with no c/o pain agreeable and ready to participate. Set up provided for UB bathing at bed level. Pt able to use bed rails and bed features to perform bed mobility with (S). Pt donned/doffed shirt with set up. Min A to doff brief at bed level. Bed pan was positioned and pt given several minutes for BM. Teds donned with total A for time mangement. Pt able to don pants with vc only for positioning. Mod A to don leg loops- with pt giving excellent caregiver instructions. Pt transferred to EOB with min A and completed slideboard transfer to w/c with min A. TLSO donned EOB with min A.  Pt was left sitting up with all needs met.   Therapy Documentation Precautions:  Precautions Precautions: Back Precaution Comments: TLSO worn when OOB Required Braces or Orthoses: Spinal Brace Spinal Brace: Thoracolumbosacral orthotic, Applied in sitting position Restrictions Weight Bearing Restrictions: No  Pain:  No pain reported   Therapy/Group: Individual Therapy  Curtis Sites 03/20/2018, 10:14 AM

## 2018-03-20 NOTE — Progress Notes (Signed)
Physical Therapy Session Note  Patient Details  Name: Erik InchesGlen Ng MRN: [id number] Date of Birth: [birth year]  Today's Date: 03/20/2018 PT Individual Time: 1115-1200; [id number] PT Individual Time Calculation (min): 45 min and 55 min  Short Term Goals: Week 2:  PT Short Term Goal 1 (Week 2): Pt will perform bed mobility with min A consistently PT Short Term Goal 2 (Week 2): Pt will perform least restrictive transfer with min A consistently PT Short Term Goal 3 (Week 2): Pt will maintain sitting balance EOB with SBA consistently  Skilled Therapeutic Interventions/Progress Updates:    Session 1: Pt received seated in w/c in room, agreeable to PT. No complaints of pain. Manual w/c propulsion x 200 ft with BUE at Mod I level. Pt requires minor setup A for w/c parts management. Slide board transfer w/c to/from mat table with CGA to min A. Pt is able to self-direct transfer and setup sliding board prior to initiating transfer. Pt also exhibits good management of BLE with use of leg loops. Sit to long-sit on therapy mat with min A for trunk control and use of leg loops for BLE management. In long-sitting position pt is able to don/doff leg loops with Supervision. Pt reclines onto wedge and is able to perform BLE stretching program with cueing for correct performance and safety. Pt left seated in w/c in room with needs in reach at end of therapy session. Home measurement sheet provided to patient's daughter who is present in room at end of session.  Session 2: Pt received seated in bed, agreeable to PT. No complaints of pain. Semi-reclined to sitting EOB with CGA. Sliding board transfer bed to w/c with mod A and one instance of near LOB posteriorly that pt requires max A to recover. Sliding board transfer w/c to/from mat table with min A with increased time and cueing needed this PM. Pt has incontinence of urine during transfer. Assisted pt back to bed. Setup A for pericare and brief change. Pt left seated  in bed with needs in reach, bed alarm in place.   Therapy Documentation Precautions:  Precautions Precautions: Back Precaution Comments: TLSO worn when OOB Required Braces or Orthoses: Spinal Brace Spinal Brace: Thoracolumbosacral orthotic, Applied in sitting position Restrictions Weight Bearing Restrictions: No Pain: Pain Assessment Pain Scale: 0-10 Pain Score: 0-No pain    Therapy/Group: Individual Therapy  Peter Congoaylor Labrian Torregrossa, PT, DPT  03/20/2018, 12:28 PM

## 2018-03-20 NOTE — Progress Notes (Signed)
Social Work Patient ID: Erik [name], male   DOB: 23-Mar-1966, 52 y.o.   MRN: [id number]   Have reviewed team conference with pt and daughters.  Both aware that team has targeted d/c of 1/7 and mod ind w/c level goals.  Daughter, Erik [name], is still working on securing a handicapped apt and believes this will be possible by d/c date.  Pt remains very motivated and pleased with his progress.  Alejah Aristizabal, LCSW

## 2018-03-20 NOTE — Progress Notes (Addendum)
[facility] PHYSICAL MEDICINE & REHABILITATION PROGRESS NOTE   Subjective/Complaints: No new complaints. Continues to progress. Denies pain  ROS: Patient denies fever, rash, sore throat, blurred vision, nausea, vomiting, diarrhea, cough, shortness of breath or chest pain, joint or back pain, headache, or mood change.     Objective:   No results found. No results for input(s): WBC, HGB, HCT, PLT in the last 72 hours. No results for input(s): NA, K, CL, CO2, GLUCOSE, BUN, CREATININE, CALCIUM in the last 72 hours.  Intake/Output Summary (Last 24 hours) at 03/20/2018 1022 Last data filed at 03/20/2018 0832 Gross per 24 hour  Intake 600 ml  Output 275 ml  Net 325 ml     Physical Exam: Vital Signs Blood pressure (!) 117/93, pulse 86, temperature (!) 97.5 F (36.4 C), temperature source Oral, resp. rate 18, weight 76.9 kg, SpO2 99 %. Constitutional: No distress . Vital signs reviewed. HEENT: EOMI, oral membranes moist Neck: supple Cardiovascular: RRR without murmur. No JVD    Respiratory: CTA Bilaterally without wheezes or rales. Normal effort    GI: BS +, non-tender, non-distended  Musculoskeletal: He exhibits edema. He exhibits no tenderness.  No edema or tenderness in extremities  Neurological: He is alert and oriented to person, place, and time.  Motor: Bilateral upper extremities: 5/5 proximal to distal Bilateral lower extremities: 0/5 proximal distal--no motor changes in the lower extremities Sensory diminished below T10, some residual at level of injury, 0/2 below. No resting tone.   -no motor and sensory changes Skin: Skin is warm and dry. He is not diaphoretic.  Back incision CDI with glue/tape in place Psychiatric: pleasant     Assessment/Plan: 1. Functional deficits secondary to thoracic spinal cord injury which require 3+ hours per day of interdisciplinary therapy in a comprehensive inpatient rehab setting.  Physiatrist is providing close team supervision and 24  hour management of active medical problems listed below.  Physiatrist and rehab team continue to assess barriers to discharge/monitor patient progress toward functional and medical goals  Care Tool:  Bathing  Bathing activity did not occur: Refused Body parts bathed by patient: Right arm, Left arm, Chest, Abdomen, Right upper leg, Left upper leg, Right lower leg, Left lower leg, Face, Front perineal area, Buttocks   Body parts bathed by helper: Buttocks Body parts n/a: Front perineal area   Bathing assist Assist Level: Set up assist     Upper Body Dressing/Undressing Upper body dressing   What is the patient wearing?: Pull over shirt Orthosis activity level: Performed by helper  Upper body assist Assist Level: Set up assist    Lower Body Dressing/Undressing Lower body dressing      What is the patient wearing?: Pants     Lower body assist Assist for lower body dressing: Set up assist     Toileting Toileting Toileting Activity did not occur (Clothing management and hygiene only): N/A (no void or bm)  Toileting assist Assist for toileting: Supervision/Verbal cueing     Transfers Chair/bed transfer  Transfers assist     Chair/bed transfer assist level: Minimal Assistance - Patient > 75%     Locomotion Ambulation   Ambulation assist   Ambulation activity did not occur: Safety/medical concerns          Walk 10 feet activity   Assist  Walk 10 feet activity did not occur: Safety/medical concerns        Walk 50 feet activity   Assist Walk 50 feet with 2 turns activity did not  occur: Safety/medical concerns         Walk 150 feet activity   Assist Walk 150 feet activity did not occur: Safety/medical concerns         Walk 10 feet on uneven surface  activity   Assist Walk 10 feet on uneven surfaces activity did not occur: Safety/medical concerns         Wheelchair     Assist Will patient use wheelchair at discharge?: Yes Type  of Wheelchair: Manual    Wheelchair assist level: Supervision/Verbal cueing Max wheelchair distance: 200'    Wheelchair 50 feet with 2 turns activity    Assist        Assist Level: Supervision/Verbal cueing   Wheelchair 150 feet activity     Assist     Assist Level: Supervision/Verbal cueing     Medical Problem List and Plan: 1.  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 2.  DVT Prophylaxis/Anticoagulation: Pharmaceutical: Lovenox bid x 3 months.   -dopplers were negative  3. Pain Management: oxycodone prn.  4. Mood: continue to provide positive reinforcement  5. Neuropsych: This patient is capable of making decisions on his own behalf. 6. Skin/Wound Care: routine pressure relief measures.  7. Fluids/Electrolytes/Nutrition: encourage PO         8. ABLA:  F/u hgb 10.7 12/16 9. Neurogenic bladder: I/O cath every 4-6 hours to keep volumes less than 350 cc.  follow volumes and patterns.   -appears to be having a lot of incontinence.  -urine now with odor  -discussed with patient that we may need to relax his bladder  -Urinalysis positive, urine culture still pending.  We will begin empiric Keflex 10 Neurogenic bowel:    -continue daily suppository    -added fiber and probiotic and stool is becoming more formed.  -working toward AM program--- improving results needs bm today--->fleet enema 12/19  -resume senna-s at HS 11. Orthostatic symptoms?:   -continue  ACE/TEDs when up   abdominal binder if needed for hypotension.     LOS: 10 days A FACE TO FACE EVALUATION WAS PERFORMED  Erik [name] 03/20/2018, 10:22 AM

## 2018-03-20 NOTE — Progress Notes (Signed)
Occupational Therapy Session Note  Patient Details  Name: Erik [name] MRN: [id number] Date of Birth: 1966/01/23  Today's Date: 03/20/2018 OT Individual Time: 1000-1100 OT Individual Time Calculation (min): 60 min    Short Term Goals: Week 2:  OT Short Term Goal 1 (Week 2): Pt will perform toileting with mod A on BSC. OT Short Term Goal 2 (Week 2): Pt will come into long sitting position from flat surface without use of bed rails and min guard for balance.  OT Short Term Goal 3 (Week 2): Pt will utilize circle sitting and long sitting positions with close supervision for balance while performing LB self care without use of bedrail.  Skilled Therapeutic Interventions/Progress Updates:    Patient in w/c prepared for therapy session.  Reviewed possible placement of DME in home environment and bowel program routine.  Completed SB transfer to/from bed, w/c, padded commode/transfer bench with CG/occ steadying assistance.  Patient able to set up w/c, with occ min A for leg rests.  Patient able to get B LEs in/out of bed with leg loops.  Noted improved trunk/balance awareness and confidence with moving from place to place. Emphasized the need for a stand by still as patient may be over confident at times.  Reviewed activities that he can continue to practice on his own that would be safe for his current level of function.  Patients daughter was present for majority of session and verbally reviewed stand by assist - positioning and things to watch for as a helper during transfers.  Patient able to propel to and from therapy gym.  Completed UE PREs with light [name] and increased repetitions for conditioning activity.  Patient returned to room with daughter present at close of session.  Therapy Documentation Precautions:  Precautions Precautions: Back Precaution Comments: TLSO worn when OOB Required Braces or Orthoses: Spinal Brace Spinal Brace: Thoracolumbosacral orthotic, Applied in sitting  position Restrictions [name] Bearing Restrictions: No General:   Vital Signs:  Pain: Pain Assessment Pain Scale: 0-10 Pain Score: 0-No pain   Therapy/Group: Individual Therapy  Barrie LymeStacey A Shirlee Whitmire 03/20/2018, 12:12 PM

## 2018-03-21 ENCOUNTER — Inpatient Hospital Stay (HOSPITAL_COMMUNITY): Payer: BLUE CROSS/BLUE SHIELD | Admitting: Physical Therapy

## 2018-03-21 ENCOUNTER — Inpatient Hospital Stay (HOSPITAL_COMMUNITY): Payer: BLUE CROSS/BLUE SHIELD | Admitting: Occupational Therapy

## 2018-03-21 ENCOUNTER — Inpatient Hospital Stay (HOSPITAL_COMMUNITY): Payer: BLUE CROSS/BLUE SHIELD

## 2018-03-21 DIAGNOSIS — S22089D Unspecified fracture of T11-T12 vertebra, subsequent encounter for fracture with routine healing: Secondary | ICD-10-CM

## 2018-03-21 DIAGNOSIS — S24104D Unspecified injury at T11-T12 level of thoracic spinal cord, subsequent encounter: Secondary | ICD-10-CM

## 2018-03-21 DIAGNOSIS — M25462 Effusion, left knee: Secondary | ICD-10-CM

## 2018-03-21 LAB — URINE CULTURE: Culture: >=100000 — AB

## 2018-03-21 MED ORDER — BISACODYL 10 MG RE SUPP
10.0000 mg | Freq: Every day | RECTAL | Status: DC
  Administered 2018-03-22 – 2018-04-10 (×20): 10 mg via RECTAL
  Filled 2018-03-21 (×21): qty 1

## 2018-03-21 MED ORDER — BISACODYL 10 MG RE SUPP: 10.0000 mg | Freq: Every day | RECTAL | Status: DC

## 2018-03-21 NOTE — Progress Notes (Signed)
Orthopedic Tech Progress Note Patient Details:  Erik [name] [birth year] [id number]  Patient ID: Erik [name], male   DOB: [birth year], 52 y.o.   MRN: [id number]   Erik FordyceJennifer C Raia [name] 03/21/2018, 10:12 AMCalled [employer] for TLSO, Left knee orthosis.

## 2018-03-21 NOTE — Progress Notes (Signed)
Occupational Therapy Session Note  Patient Details  Name: Erik InchesGlen Stanard MRN: [id number] Date of Birth: 07-14-1965  Today's Date: 03/21/2018 OT Individual Time: [id number] and [id number] OT Individual Time Calculation (min): 70 min and 43 min  Short Term Goals: Week 2:  OT Short Term Goal 1 (Week 2): Pt will perform toileting with mod A on BSC. OT Short Term Goal 2 (Week 2): Pt will come into long sitting position from flat surface without use of bed rails and min guard for balance.  OT Short Term Goal 3 (Week 2): Pt will utilize circle sitting and long sitting positions with close supervision for balance while performing LB self care without use of bedrail.  Skilled Therapeutic Interventions/Progress Updates:    Pt greeted in bed and requested to complete bathing. Tx focus on adaptive bathing/dressing skills and functional endurance during self care tasks. Pt completed bathing and dressing bedlevel utilizing long and circle sitting positions. Encouraged him to adjust bed features himself and direct care as he would for caregivers at home. Also worked on intermittent long sitting while sitting unsupported during UB self care using B bedrails for pulling into position and also for maintaining balance. Min A for rolling Rt>Lt to complete perihygiene and elevate pants over hips. After collaboration with RN, discussed plans for bowel program with use of padded BSC. Pt motivated to try, and had several questions about adaptive methods. He would benefit from simulated practice prior to trial run on Monday. He also c/o Rt knee swelling, with MD in to assess during session. Grooming tasks completed with setup. At end of session pt was left in bed with all needs within reach and bed alarm set.    2nd Session: 1:1 tx (43 min) Pt greeted in TIS with leg loops and TLSO donned. Agreeable to tx. Practiced simulated BSC transfers and toileting tasks during session in prep for bowel program. Min A for  slideboard<bed<drop arm BSC with vcs for LE mgt during transfer. While seated, practiced techniques for digital stim, perihygiene, and clothing mgt x2. Pt still uncomfortable with lateral leans on elbows to elevate pants.  Able to complete squat-stand for OT to elevate pants over hips. When he transferred back to EOB, practiced lateral leans onto elbows using leg loops as needed for LE mgt. With increased time and rest breaks, able to lower and elevate clothing with supervision-steady assist. He would benefit from additional practice on drop arm BSC (we did not due to exacerbation of rib pain). He then reported having incontinent BM. Pt transitioned to supine with supervision using leg loops, and then rolled towards Lt for OT to complete perihygiene with Min A. Pt continued to void liquid stool. Bedpan placed beneath him to finish voiding. Pt was left with all needs and staff made aware of his position. Notified staff to check on pt in 5 minutes.    Therapy Documentation Precautions:  Precautions Precautions: Back Precaution Comments: TLSO worn when OOB Required Braces or Orthoses: Spinal Brace Spinal Brace: Thoracolumbosacral orthotic, Applied in sitting position Restrictions Weight Bearing Restrictions: Yes Pain: In back. RN in to provide pain medicine at start of 1st session. Recommended k pad for decreasing tension in back at night, and also abdominal binder for rib pain, with RN verbalizing that she would order both.  Pain Assessment Pain Score: 2  ADL:     Therapy/Group: Individual Therapy  Lamyia Cdebaca A Anyela Napierkowski 03/21/2018, 12:25 PM

## 2018-03-21 NOTE — Progress Notes (Signed)
Physical Therapy Session Note  Patient Details  Name: Erik [name] MRN: [id number] Date of Birth: 10/13/1965  Today's Date: 03/21/2018 PT Individual Time: 1000-1100; 1300-1330 PT Individual Time Calculation (min): 60 min and 30 min  Short Term Goals: Week 2:  PT Short Term Goal 1 (Week 2): Pt will perform bed mobility with min A consistently PT Short Term Goal 2 (Week 2): Pt will perform least restrictive transfer with min A consistently PT Short Term Goal 3 (Week 2): Pt will maintain sitting balance EOB with SBA consistently  Skilled Therapeutic Interventions/Progress Updates:    Session 1: Pt received seated in bed, agreeable to PT. No complaints of pain. Pt is able to don one leg lifter in long-sitting in bed with back [facility] with setup A, therapist assisted with donning second leg lifter for time management. Long-sit to sitting EOB with CGA. Pt is min A to don TLSO while seated EOB. Slide board transfer bed to w/c with min A, ongoing multimodal cueing for head/hips relationship during transfer. Pt is apprehensive with anterior lean due to feeling unsteady and fear of falling. Manual w/c propulsion x 200 ft with BUE and Supervision for management of w/c parts. Car transfer via slide board with mod A, max verbal cueing for safety and BLE management during transfer. Sitting balance in chair with back unsupported with focus on anterior lean reaching towards mat table and then bringing light weight therapy ball forwards, pt requires min A to maintain sitting balance with leaning outside BOS. Pt left seated in w/c in room with needs in reach at end of therapy session.  Session 2: Pt received seated in bed, no complaints of pain. Pt reports having just had urinary incontinence. Pt initially resistant to changing his brief at this time due to it being time for scheduled therapy, education with patient about skin integrity and that he should not sit in a wet brief and should always change if  he has had incontinence, pt agreeable. Pt is setup A for pericare and donning of new brief. Long-sit to sitting EOB with CGA. Slide board transfer bed to w/c with min A. Pt left seated in w/c in room with needs in reach.   Therapy Documentation Precautions:  Precautions Precautions: Back Precaution Comments: TLSO worn when OOB Required Braces or Orthoses: Spinal Brace Spinal Brace: Thoracolumbosacral orthotic, Applied in sitting position Restrictions Weight Bearing Restrictions: Yes   Therapy/Group: Individual Therapy  Peter Congoaylor Mystic Labo, PT, DPT  03/21/2018, 12:18 PM

## 2018-03-21 NOTE — Progress Notes (Signed)
Orthopedic Tech Progress Note Patient Details:  Phineas InchesGlen Kolodziej September 01, 1965 [id number] Applied with Leone BrandJennifer   Ortho Devices Type of Ortho Device: Abdominal binder Ortho Device/Splint Interventions: Application   Post Interventions Patient Tolerated: Well Instructions Provided: Care of device   Donald PoreSade L Norie Latendresse 03/21/2018, 3:51 PM

## 2018-03-21 NOTE — Progress Notes (Signed)
RN educated pt on bowel and bladder management. RN discussed bowel program with timing of suppositry with digital stimulation and use of BSC. RN discussed and set up [facility] and digital stimulation for management of bowel program at home. Patient is to learn self administation of suppositry with digital stimulation on BSC.  RN understands the importance of management of bowel regimen. RN also educated on bladder management. Pt unable to feel the urge or need to void. RN educated on timed tolieting and attempting voids q4-6 hrs.  Pt last I&O cathed 12-17 with pt having incont/cont. Episodes of voiding with PVRs low. RN attempts timed toileting throughout shift. RN and NT to continue POC.

## 2018-03-21 NOTE — Progress Notes (Signed)
Occupational Therapy Session Note  Patient Details  Name: Erik [name] MRN: [id number] Date of Birth: 1965/12/02  Today's Date: 03/21/2018 OT Individual Time: 1103-1200 OT Individual Time Calculation (min): 57 min    Short Term Goals: Week 2:  OT Short Term Goal 1 (Week 2): Pt will perform toileting with mod A on BSC. OT Short Term Goal 2 (Week 2): Pt will come into long sitting position from flat surface without use of bed rails and min guard for balance.  OT Short Term Goal 3 (Week 2): Pt will utilize circle sitting and long sitting positions with close supervision for balance while performing LB self care without use of bedrail.  Skilled Therapeutic Interventions/Progress Updates:    Treatment session with focus on functional transfers and lateral leans as needed for toileting and bowel program.  Pt received upright in w/c with RN present discussing plan to proceed forward with bowel program and need for pt to increase participation and awareness of bowel program.  Pt attempted to place slide board in preparation for transfer to padded tub bench with cut out to simulate toileting/ bowel program needs.  Therapist assisted with slide board placement as pt unable to maintain sitting balance while attempting to lift LLE and place slide board.  Pt completed transfer to tub bench with min assist/CGA.  Engaged in lateral leans to simulate doffing pants. Pt unable to fully reach rectum in set up to perform dig stim, may benefit from trialing alternative toilet.  Pt reports having had BM during transfers, therefore returned tub bench > w/c > toilet with min assist and setup of slide board.  RN present to assist with hygiene at bed level.  Pt remained seated upright in bed with lunch tray set up.  Therapist able to obtain a wide drop arm BSC to attempt during next therapy session.    Therapy Documentation Precautions:  Precautions Precautions: Back Precaution Comments: TLSO worn when OOB Required  Braces or Orthoses: Spinal Brace Spinal Brace: Thoracolumbosacral orthotic, Applied in sitting position Restrictions Weight Bearing Restrictions: Yes Pain: Pain Assessment Pain Score: 2    Therapy/Group: Individual Therapy  Rosalio LoudHOXIE, Trevante Tennell 03/21/2018, 12:24 PM
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
## 2018-03-22 ENCOUNTER — Inpatient Hospital Stay (HOSPITAL_COMMUNITY): Payer: BLUE CROSS/BLUE SHIELD | Admitting: Physical Therapy

## 2018-03-22 NOTE — Plan of Care (Signed)
  Problem: SCI BOWEL ELIMINATION Goal: RH STG MANAGE BOWEL WITH ASSISTANCE Description: STG Manage Bowel with Min Assistance. Outcome: Not Progressing; bowel program   

## 2018-03-22 NOTE — Progress Notes (Signed)
Patient's daughter concern re: pt's left knee swollen and (+) fracture. MD notified and spoke with daughter. Per MD no limitations on therapy. MD updated RN that ortho consult was done.
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
## 2018-03-23 ENCOUNTER — Inpatient Hospital Stay (HOSPITAL_COMMUNITY): Payer: BLUE CROSS/BLUE SHIELD

## 2018-03-23 NOTE — Plan of Care (Signed)
  Problem: SCI BOWEL ELIMINATION Goal: RH STG MANAGE BOWEL WITH ASSISTANCE Description STG Manage Bowel with  Min Assistance.  Outcome: Progressing; patient very much aware of his bowel program.
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
## 2018-03-24 ENCOUNTER — Inpatient Hospital Stay (HOSPITAL_COMMUNITY): Payer: BLUE CROSS/BLUE SHIELD | Admitting: Physical Therapy

## 2018-03-24 ENCOUNTER — Inpatient Hospital Stay (HOSPITAL_COMMUNITY): Payer: BLUE CROSS/BLUE SHIELD | Admitting: Occupational Therapy

## 2018-03-24 LAB — BASIC METABOLIC PANEL
Anion gap: 7 (ref 5–15)
BUN: 13 mg/dL (ref 6–20)
CO2: 31 mmol/L (ref 22–32)
Calcium: 9 mg/dL (ref 8.9–10.3)
Chloride: 98 mmol/L (ref 98–111)
Creatinine, Ser: 0.96 mg/dL (ref 0.61–1.24)
GFR calc Af Amer: >60 mL/min (ref >60)
GFR calc non Af Amer: >60 mL/min (ref >60)
Glucose, Bld: 107 mg/dL — ABNORMAL HIGH (ref 70–99)
Potassium: 3.9 mmol/L (ref 3.5–5.1)
Sodium: 136 mmol/L (ref 135–145)

## 2018-03-24 LAB — CBC
HCT: 35 % — ABNORMAL LOW (ref 39.0–52.0)
Hemoglobin: 11.7 g/dL — ABNORMAL LOW (ref 13.0–17.0)
MCH: 30.2 pg (ref 26.0–34.0)
MCHC: 33.4 g/dL (ref 30.0–36.0)
MCV: 90.4 fL (ref 80.0–100.0)
Platelets: 352 10*3/uL (ref 150–400)
RBC: 3.87 MIL/uL — ABNORMAL LOW (ref 4.22–5.81)
RDW: 12.7 % (ref 11.5–15.5)
WBC: 3.5 10*3/uL — ABNORMAL LOW (ref 4.0–10.5)
nRBC: 0 % (ref 0.0–0.2)

## 2018-03-24 NOTE — Progress Notes (Signed)
Occupational Therapy Session Note  Patient Details  Name: Erik InchesGlen Ligman MRN: [id number] Date of Birth: 03-07-1966  Today's Date: 03/24/2018 OT Individual Time: [id number] OT Individual Time Calculation (min): 43 min   Short Term Goals: Week 2:  OT Short Term Goal 1 (Week 2): Pt will perform toileting with mod A on BSC. OT Short Term Goal 2 (Week 2): Pt will come into long sitting position from flat surface without use of bed rails and min guard for balance.  OT Short Term Goal 3 (Week 2): Pt will utilize circle sitting and long sitting positions with close supervision for balance while performing LB self care without use of bedrail.  Skilled Therapeutic Interventions/Progress Updates:    Pt greeted in [facility], ready to go. He self propelled to gift shop to work on UB strengthening, endurance, and w/c navigation in tight spaces. While in gift shop, discussed community mobility post d/c with family to increase psychosocial wellness, social participation, and maintain physical health. He completed slideboard<restaurant booth with Min-Mod A and vcs for w/c setup and mgt of LEs using leg loops. Afterwards he self propelled back to room and was left with all needs within reach.   Therapy Documentation Precautions:  Precautions Precautions: Back Precaution Comments: TLSO worn when OOB Required Braces or Orthoses: Spinal Brace Spinal Brace: Thoracolumbosacral orthotic, Applied in sitting position Restrictions Weight Bearing Restrictions: No Vital Signs: Therapy Vitals Temp: 98.5 F (36.9 C) Temp Source: Oral Pulse Rate: 90 Resp: 16 BP: (!) 131/98 Patient Position (if appropriate): Sitting Oxygen Therapy SpO2: 100 % O2 Device: Room Air Pain: No s/s pain during tx    ADL:        Therapy/Group: Individual Therapy  Larenda Reedy A Areg Bialas 03/24/2018, 4:07 PM

## 2018-03-24 NOTE — Progress Notes (Signed)
Physical Therapy Session Note  Patient Details  Name: Erik [name] MRN: [id number] Date of Birth: [birth year]  Today's Date: 03/24/2018 PT Individual Time: 0945-1100 PT Individual Time Calculation (min): 75 min   Short Term Goals: Week 2:  PT Short Term Goal 1 (Week 2): Pt will perform bed mobility with min A consistently PT Short Term Goal 2 (Week 2): Pt will perform least restrictive transfer with min A consistently PT Short Term Goal 3 (Week 2): Pt will maintain sitting balance EOB with SBA consistently  Skilled Therapeutic Interventions/Progress Updates:    Pt received seated in w/c in room, agreeable to PT. See pain details below. Manual w/c propulsion x 150 ft with BUE and Supervision. Slide board transfer w/c to/from mat table to varying surface heights with min A. Sitting balance EOM with CGA with focus on leaning anteriorly to improve head/hips relationship with transfers. Pt has increased anxiety with anterior weight-shifting, needs encouragement from therapist and reassurance that he is safe to attempt this. Seated push-ups with push-up blocks 2 x 10 reps, focus on pt counting reps out loud to avoid valsalva maneuver. Pt has incontinence of bowel and urine during push-ups. Slide board transfer back to w/c then back to bed with min A. Sit to supine CGA. Rolling L/R with CGA and use of bedrails, min A for pericare and changing of brief. Supine to sit with CGA and use of bedrails, HOB elevated. Slide board transfer back to w/c with min A. Pt demos fair carryover of head/hips relationship practice to real transfers, will need continued follow up to improve safety with transfers and decrease shearing of buttocks on slide board. Set pt up with new w/c for improved independence with management of w/c parts, will trial chair next visit. Pt left seated in w/c in room with needs in reach.  Therapy Documentation Precautions:  Precautions Precautions: Back Precaution Comments: TLSO worn when  OOB Required Braces or Orthoses: Spinal Brace Spinal Brace: Thoracolumbosacral orthotic, Applied in sitting position Restrictions Weight Bearing Restrictions: No Pain: Pain Assessment Pain Scale: 0-10 Pain Score: 0-No pain    Therapy/Group: Individual Therapy  Peter Congoaylor Emery Binz, PT, DPT  03/24/2018, 12:27 PM

## 2018-03-24 NOTE — Progress Notes (Signed)
Occupational Therapy Session Note  Patient Details  Name: Erik [name] MRN: [id number] Date of Birth: 28-Jan-1966  Today's Date: 03/24/2018 OT Individual Time: [id number] OT Individual Time Calculation (min): 85 min    Short Term Goals: Week 2:  OT Short Term Goal 1 (Week 2): Pt will perform toileting with mod A on BSC. OT Short Term Goal 2 (Week 2): Pt will come into long sitting position from flat surface without use of bed rails and min guard for balance.  OT Short Term Goal 3 (Week 2): Pt will utilize circle sitting and long sitting positions with close supervision for balance while performing LB self care without use of bedrail.  Skilled Therapeutic Interventions/Progress Updates:    Upon entering the room, pt supine in bed with no c/o pain this session. Pt agreeable to OT intervention. Pt very concerns since he now has LE fracture and is wearing L knee brace he is unable to utilize circle sitting for bathing and dressing tasks on L side. OT and pt spending extra time to problem solve. OT assisting pt with donning B TED hose and L knee brace. Pt coming into long sitting position with min guard and use of bed rails. Pt utilized LH reacher with min verbal cuing and min A to thread pants onto L foot. Pt able to roll L <> R to pull over B hips. Pt donning B leg loops with increased time and performed supine >sit with min guard. OT discussed set up for toileting and pt able to perform lateral scoot onto and off of drop arm commode chair with min verbal cuing and min guard for balance. Pt returning to bed and then transferring into wheelchair with min guard and use of slide board. Pt needing min cuing and increased time for technique and set up. Pt remained seated in wheelchair with call bell and all needed items within reach. Breakfast tray placed in front of him.   Therapy Documentation Precautions:  Precautions Precautions: Back Precaution Comments: TLSO worn when OOB Required Braces or  Orthoses: Spinal Brace Spinal Brace: Thoracolumbosacral orthotic, Applied in sitting position Restrictions Weight Bearing Restrictions: No Pain: Pain Assessment Pain Scale: 0-10 Pain Score: 0-No pain   Therapy/Group: Individual Therapy  Alen BleacherBradsher, Hosey Burmester P 03/24/2018, 10:46 AM

## 2018-03-25 ENCOUNTER — Inpatient Hospital Stay (HOSPITAL_COMMUNITY): Payer: BLUE CROSS/BLUE SHIELD | Admitting: Physical Therapy

## 2018-03-25 ENCOUNTER — Inpatient Hospital Stay (HOSPITAL_COMMUNITY): Payer: BLUE CROSS/BLUE SHIELD | Admitting: Occupational Therapy

## 2018-03-25 NOTE — Progress Notes (Signed)
Physical Therapy Session Note  Patient Details  Name: Erik [name] MRN: [id number] Date of Birth: May 13, 1965  Today's Date: 03/25/2018 PT Individual Time: 1502-1530 PT Individual Time Calculation (min): 28 min   Short Term Goals: Week 3:  PT Short Term Goal 1 (Week 3): Pt will perform bed mobility with SBA consistently PT Short Term Goal 2 (Week 3): Pt will complete least restrictive transfer with SBA consistently PT Short Term Goal 3 (Week 3): Pt will demonstrate independence with home stretching program  Skilled Therapeutic Interventions/Progress Updates:    Patient received in [facility], very pleasant and motivated to participate in therapy. Focused this session on endurance/UE strengthening in [facility] via long distance WC propulsion on level surfaces throughout hospital as well as introduction of WC propulsion up and down inclines initially with min guard progressing to S for safety of WC management on hills/slopes. Patient able to navigate [facility] well over long distances,  Mild-moderate hills, and through crowds this session. He was returned to his room and left up in [facility] with family present and all needs otherwise met this afternoon.   Therapy Documentation Precautions:  Precautions Precautions: Back Precaution Comments: TLSO worn when OOB Required Braces or Orthoses: Spinal Brace Spinal Brace: Thoracolumbosacral orthotic, Applied in sitting position Restrictions Weight Bearing Restrictions: No General:   Pain: Pain Assessment Pain Scale: 0-10 Pain Score: 0-No pain    Therapy/Group: Individual Therapy  Deniece Ree PT, DPT, CBIS  Supplemental Physical Therapist [facility]    Pager [phone] Acute Rehab Office [phone]   03/25/2018, 4:06 PM

## 2018-03-25 NOTE — Progress Notes (Signed)
Physical Therapy Weekly Progress Note  Patient Details  Name: Erik [name] MRN: [id number] Date of Birth: [birth year]  Beginning of progress report period: March 18, 2018 End of progress report period: March 25, 2018  Today's Date: 03/25/2018 PT Individual Time: 0900-1000 PT Individual Time Calculation (min): 60 min   Patient has met 3 of 3 short term goals.  Pt continues to make great progress during therapy sessions. Pt is currently at [facility] for bed mobility with use of bedrails and hospital bed features, min A for SB transfers, and at mod I level for basic w/c mobility.   Patient continues to demonstrate the following deficits muscle weakness and muscle paralysis, abnormal tone, unbalanced muscle activation and decreased coordination and decreased sitting balance, decreased postural control and decreased balance strategies and therefore will continue to benefit from skilled PT intervention to increase functional independence with mobility. Some sessions with patient have been limited by ongoing incontinence as pt continues to progress with his bowel and bladder program. Pt has also been limited by discovery of L fibula hairline fracture limiting pt's ability to perform circle-sitting which is position pt has been using to perform home stretching program as well as ADLs with OT.  Patient progressing toward long term goals..  Continue plan of care.  PT Short Term Goals Week 2:  PT Short Term Goal 1 (Week 2): Pt will perform bed mobility with min A consistently PT Short Term Goal 1 - Progress (Week 2): Met PT Short Term Goal 2 (Week 2): Pt will perform least restrictive transfer with min A consistently PT Short Term Goal 2 - Progress (Week 2): Met PT Short Term Goal 3 (Week 2): Pt will maintain sitting balance EOB with SBA consistently PT Short Term Goal 3 - Progress (Week 2): Met Week 3:  PT Short Term Goal 1 (Week 3): Pt will perform bed mobility with SBA consistently PT Short Term Goal  2 (Week 3): Pt will complete least restrictive transfer with SBA consistently PT Short Term Goal 3 (Week 3): Pt will demonstrate independence with home stretching program  Skilled Therapeutic Interventions/Progress Updates:  Pt received seated in w/c in room, agreeable to PT. Slide board transfer w/c to bed with min A, pt is able to complete setup of transfer with min cueing and just needs min A for trunk control during transfer as well as cues for BLE placement during transfer. Sitting balance EOB with close SBA. Set pt up with new w/c for improved each of reaching brakes, arm rests, and "dumped" w/c for increased comfort and pt stability in chair. Slide board transfer from new w/c to various height surfaces with min A with occasional mod A needed when going uphill. Manual w/c propulsion up/down ramp with CGA for safety. Seated balance EOM bringing therapy ball from lap around body with min to mod A needed to maintain sitting balance without use of BUE to assist with balance. Pt left seated in w/c in room with needs in reach at end of therapy session.  Therapy Documentation Precautions:  Precautions Precautions: Back Precaution Comments: TLSO worn when OOB Required Braces or Orthoses: Spinal Brace Spinal Brace: Thoracolumbosacral orthotic, Applied in sitting position Restrictions Weight Bearing Restrictions: No Pain: Pain Assessment Pain Scale: 0-10 Pain Score: 0-No pain   Therapy/Group: Individual Therapy   Excell Seltzer, PT, DPT 03/25/2018, 12:06 PM

## 2018-03-25 NOTE — Progress Notes (Signed)
Occupational Therapy Session Note  Patient Details  Name: Erik [name] MRN: [id number] Date of Birth: 1965-07-23  Today's Date: 03/25/2018 OT Individual Time: 0702-0830 OT Individual Time Calculation (min): 88 min    Short Term Goals: Week 2:  OT Short Term Goal 1 (Week 2): Pt will perform toileting with mod A on BSC. OT Short Term Goal 2 (Week 2): Pt will come into long sitting position from flat surface without use of bed rails and min guard for balance.  OT Short Term Goal 3 (Week 2): Pt will utilize circle sitting and long sitting positions with close supervision for balance while performing LB self care without use of bedrail.  Skilled Therapeutic Interventions/Progress Updates:    Upon entering the room, pt in bed and on bed pan after bowel program initiated prior to OT arrival. Pt verbalizing, " I think I am done." Pt rolling L with min A and use of grab bar to remove bedpan. Pt able to perform hygiene himself with set up this session. Pt returning to supine and bathing self with set up A but needing assistance to wash L LE secondary to unable to come into full circle sitting with recent fx. OT provided total A in donning B TED hose. Pt donning pull over pants with assist to thread onto L foot but can perform with use of reacher and increased time. Pt donning B LE leg loops with increased time in long sitting. Pt utilized leg loops with min guard to come to seated position on EOB. Transfer into wheelchair with use of slide board and min guard for balance and to steady equipment. Pt placed all wheelchair parts on with increased time. Pt propelled to sink for grooming tasks and then returning to breakfast tray set up in front of him. Call bell and all needed items within reach upon exiting the room.   Therapy Documentation Precautions:  Precautions Precautions: Back Precaution Comments: TLSO worn when OOB Required Braces or Orthoses: Spinal Brace Spinal Brace: Thoracolumbosacral orthotic,  Applied in sitting position Restrictions Weight Bearing Restrictions: No Pain: Pain Assessment Pain Scale: 0-10 Pain Score: 0-No pain   Therapy/Group: Individual Therapy  Alen BleacherBradsher, Mickala Laton P 03/25/2018, 12:39 PM

## 2018-03-25 NOTE — Patient Care Conference (Signed)
Inpatient RehabilitationTeam Conference and Plan of Care Update Date: 03/25/2018   Time: 11:35 AM    Patient Name: Erik InchesGlen Leys      Medical Record Number: [id number]  Date of Birth: 11/08/1965 Sex: Male         Room/Bed: 4W16C/4W16C-01 Payor Info: Payor: BLUE CR[employer]UE [employer]IELD / Plan: BCBS OTHER / Product Type: *No Product type* /    Admitting Diagnosis: Paraplegia  Admit Date/Time:  03/10/2018  1:23 PM Admission Comments: No comment available   Primary Diagnosis:  Closed fracture of T11 vertebra with spinal cord injury (HCC) Principal Problem: Closed fracture of T11 vertebra with spinal cord injury [facility](HCC)  Patient Active Problem List   Diagnosis Date Noted  . Closed fracture of T11 vertebra with spinal cord injury (HCC) 03/10/2018  . Acute blood loss anemia   . Orthostasis   . Neurogenic bladder   . Neurogenic bowel   . Paraplegia [facility](HCC)     Expected Discharge Date: Expected Discharge Date: 04/08/18  Team Members Present: Physician leading conference: Dr. Claudette LawsAndrew Kirsteins Social Worker Present: Amada JupiterLucy Trenisha Lafavor, LCSW Nurse Present: Chana Bodeeborah Sharp, RN PT Present: Other (comment)(Taylor Serita Griturkalo, PT) OT Present: Jackquline DenmarkKatie Bradsher, OT SLP Present: Colin BentonMadison Cratch, SLP PPS Coordinator present : Tora DuckMarie Noel, RN, CRRN     Current Status/Progress Goal Weekly Team Focus  Medical   Left knee swellling fibualr fx non displaced, no WB restrictions  mangement of neurogenic boweland bladder  se above   Bowel/Bladder   LBM 12/23  patient to progress to urinate without need to cath.    Q2h toieting, assess bowel and bladder needs qshift and PRN   Swallow/Nutrition/ Hydration             ADL's   min guard  rolling, use of long sitting/circle sitting with min guard for balance, use of reacher for L LE dressing, set up A UB self care, min A toileting and toilet transfer  mod I overall from wheelchair level with use of slide board for functional transfers  functional transfers, ADL retraining,  balance, strengthening, pt/family education   Mobility   CGA to min A bed mobility, CGA to min A SB transfer, mod I w/c mobility  Mod I with all transfers and w/c mobility  transfers, sitting balance, stretches, SCI edu, w/c eval   Communication             Safety/Cognition/ Behavioral Observations            Pain   Pain controled with PRN tramadol.  pain to remain controlled with current regamine   Assess pain qshift and PRN   Skin   Surgical incision to back. Wound edges well approximated, no drainage noted. No other skin issues  Pt willl remain free of pressure ulcers.   Assess skin qshift and PRN    Rehab Goals Patient on target to meet rehab goals: Yes *See Care Plan and progress notes for long and short-term goals.     Barriers to Discharge  Current Status/Progress Possible Resolutions Date Resolved   Physician    Medical stability;Neurogenic Bowel & Bladder;Other (comments)  fibular fracture identified     bowel and bladder program per RN      Nursing                  PT                    OT  SLP                SW                Discharge Planning/Teaching Needs:  Home with girlfriend who can provide intermittent assist.    family education on I/O cath   Team Discussion:  Newly found fib fx (non-displaced) which appears to be ~2 wks old.  Now unable to do circle sitting and OT working on different techniques for ADLs.  Min A with ADLs now.  Min assist with sl. Board and more difficulty with uneven surfaces.  Therapists feel pt will need full LOS.  Revisions to Treatment Plan:  Newly found fib fx which is affecting ADLs.    Continued Need for Acute Rehabilitation Level of Care: The patient requires daily medical management by a physician with specialized training in physical medicine and rehabilitation for the following conditions: Daily direction of a multidisciplinary physical rehabilitation program to ensure safe treatment while eliciting the  highest outcome that is of practical value to the patient.: Yes Daily medical management of patient stability for increased activity during participation in an intensive rehabilitation regime.: Yes Daily analysis of laboratory values and/or radiology reports with any subsequent need for medication adjustment of medical intervention for : Post surgical problems;Neurological problems;Urological problems   I attest that I was present, lead the team conference, and concur with the assessment and plan of the team.   Ollis Daudelin 03/25/2018, 2:05 PM

## 2018-03-25 NOTE — Progress Notes (Addendum)
[facility] PHYSICAL MEDICINE & REHABILITATION PROGRESS NOTE   Subjective/Complaints: No sensation or movement in BLE, knee swelling continues on left  Notices bumps on face which improve with cream, no itching or erythema noted  ROS: Patient deniesblurred vision, nausea, vomiting, diarrhea, cough, shortness of breath     Objective:   No results found. Recent Labs    03/24/18 0535  WBC 3.5*  HGB 11.7*  HCT 35.0*  PLT 352   Recent Labs    03/24/18 0535  NA 136  K 3.9  CL 98  CO2 31  GLUCOSE 107*  BUN 13  CREATININE 0.96  CALCIUM 9.0    Intake/Output Summary (Last 24 hours) at 03/25/2018 0826 Last data filed at 03/25/2018 0244 Gross per 24 hour  Intake 480 ml  Output 475 ml  Net 5 ml     Physical Exam: Vital Signs Blood pressure 135/75, pulse 87, temperature 97.8 F (36.6 C), temperature source Oral, resp. rate 15, weight 76 kg, SpO2 98 %. Constitutional: No distress . Vital signs reviewed. HEENT: EOMI, oral membranes moist Neck: supple Cardiovascular: RRR without murmur. No JVD    Respiratory: CTA Bilaterally without wheezes or rales. Normal effort    GI: BS +, non-tender, non-distended  Musculoskeletal: He exhibits edema. He exhibits no tenderness.  Left knee effusion, no erythema or warmth, no pain with ROM, neg Lachmann no M-L instab Neurological: He is alert and oriented to person, place, and time.  Motor: Bilateral upper extremities: 5/5 proximal to distal Bilateral lower extremities: 0/5 proximal distal--no motor changes in the lower extremities Sensory diminished below T10, some residual at level of injury, 0/2 below. No resting tone.   -no motor and sensory changes Skin: Skin is warm and dry. He is not diaphoretic.  Back incision CDI with glue/tape in place Psychiatric: pleasant     Assessment/Plan: 1. Functional deficits secondary to thoracic spinal cord injury which require 3+ hours per day of interdisciplinary therapy in a comprehensive  inpatient rehab setting.  Physiatrist is providing close team supervision and 24 hour management of active medical problems listed below.  Physiatrist and rehab team continue to assess barriers to discharge/monitor patient progress toward functional and medical goals  Care Tool:  Bathing  Bathing activity did not occur: Refused Body parts bathed by patient: Right arm, Left arm, Chest, Abdomen, Right upper leg, Left upper leg, Right lower leg, Left lower leg, Face, Front perineal area, Buttocks   Body parts bathed by helper: Buttocks Body parts n/a: Front perineal area   Bathing assist Assist Level: Minimal Assistance - Patient > 75%     Upper Body Dressing/Undressing Upper body dressing   What is the patient wearing?: Pull over shirt Orthosis activity level: Performed by helper  Upper body assist Assist Level: Set up assist    Lower Body Dressing/Undressing Lower body dressing      What is the patient wearing?: Pants     Lower body assist Assist for lower body dressing: Contact Guard/Touching assist     Toileting Toileting Toileting Activity did not occur (Clothing management and hygiene only): N/A (no void or bm)  Toileting assist Assist for toileting: Minimal Assistance - Patient > 75%     Transfers Chair/bed transfer  Transfers assist     Chair/bed transfer assist level: Minimal Assistance - Patient > 75%     Locomotion Ambulation   Ambulation assist   Ambulation activity did not occur: Safety/medical concerns          Walk  10 feet activity   Assist  Walk 10 feet activity did not occur: Safety/medical concerns        Walk 50 feet activity   Assist Walk 50 feet with 2 turns activity did not occur: Safety/medical concerns         Walk 150 feet activity   Assist Walk 150 feet activity did not occur: Safety/medical concerns         Walk 10 feet on uneven surface  activity   Assist Walk 10 feet on uneven surfaces activity did  not occur: Safety/medical concerns         Wheelchair     Assist Will patient use wheelchair at discharge?: Yes Type of Wheelchair: Manual    Wheelchair assist level: Supervision/Verbal cueing Max wheelchair distance: 200'    Wheelchair 50 feet with 2 turns activity    Assist        Assist Level: Supervision/Verbal cueing   Wheelchair 150 feet activity     Assist     Assist Level: Supervision/Verbal cueing     Medical Problem List and Plan: 1.  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 Formulized individual treatment plan based on medical history, underlying problem and comorbidities. 2.  DVT Prophylaxis/Anticoagulation: Pharmaceutical: Lovenox bid x 3 months.   -dopplers were negative  3. Pain Management: oxycodone prn.  4. Mood: continue to provide positive reinforcement  5. Neuropsych: This patient is capable of making decisions on his own behalf. 6. Skin/Wound Care: routine pressure relief measures.  7. Fluids/Electrolytes/Nutrition: encourage PO         8. ABLA:  F/u hgb 10.7 12/16 9. Neurogenic bladder: I/O cath every 4-6 hours to keep volumes less than 350 cc.  follow volumes and patterns.   -appears to be having a lot of incontinence.  -urine now with odor  -discussed with patient that we may need to relax his bladder  -10 Neurogenic bowel:    -continue daily suppository - has had BMs ~6am x last 2 ams   -added fiber and probiotic and stool is becoming more formed.  -working toward AM program--- improving results needs bm today--->fleet enema 12/19  -resume senna-s at HS 11. Orthostatic symptoms?:   -continue  ACE/TEDs when up   abdominal binder if needed for  hypotension.   12.  Left knee effusion-fibular fx, noted on Xray, on call MD 12/21 conferred with ortho, who viewed films and stated no displacement, pt has no WB restrictions or ROM restrictions, no knee splinting  Since pt is paraplegic- would f/u xray in 1wk to check for displacement   13.  UTI sens to keflex completing tx   LOS: 15 days A FACE TO FACE EVALUATION WAS PERFORMED  Charlett Blake 03/25/2018, 8:26 AM

## 2018-03-25 NOTE — Progress Notes (Signed)
Social Work Patient ID: Erik [name], male   DOB: June 27, 1965, 52 y.o.   MRN: [id number]  Have reviewed team conference with pt and daughter.  Both aware that team continues to plan toward 04/08/18 d/c date especially with new findings of fib fx.  Daughter remains very supportive and team hopes to be able to still reach a mod ind w/c level.  Planning for w/c eval on Friday.  Erik Mirabal, LCSW

## 2018-03-25 NOTE — Plan of Care (Signed)
  Problem: RH SAFETY Goal: RH STG ADHERE TO SAFETY PRECAUTIONS W/ASSISTANCE/DEVICE Description STG Adhere to Safety Precautions With min Assistance/Device.  Outcome: Progressing  Call light within reach, bed/chair alarm, proper footwear.  

## 2018-03-26 NOTE — Plan of Care (Signed)
  Problem: Consults Goal: RH SPINAL CORD INJURY PATIENT EDUCATION Description  See Patient Education module for education specifics.  Outcome: Progressing   Problem: SCI BOWEL ELIMINATION Goal: RH STG MANAGE BOWEL WITH ASSISTANCE Description STG Manage Bowel with  Min Assistance.  Outcome: Progressing Goal: RH STG SCI MANAGE BOWEL WITH MEDICATION WITH ASSISTANCE Description STG SCI Manage bowel with medication with Min assistance.  Outcome: Progressing Goal: RH STG MANAGE BOWEL W/EQUIPMENT W/ASSISTANCE Description STG Manage Bowel With Equipment With Min Assistance  Outcome: Progressing Goal: RH STG SCI MANAGE BOWEL PROGRAM W/ASSIST OR AS APPROPRIATE Description STG SCI Manage bowel program w/min assist or as appropriate.  Outcome: Progressing   Problem: SCI BLADDER ELIMINATION Goal: RH STG MANAGE BLADDER WITH ASSISTANCE Description STG Manage Bladder With Min Assistance  Outcome: Progressing Goal: RH STG MANAGE BLADDER WITH EQUIPMENT WITH ASSISTANCE Description STG Manage Bladder With Equipment With min  Assistance  Outcome: Progressing Goal: RH STG SCI MANAGE BLADDER PROGRAM W/ASSISTANCE Description Pt will be Mod I with equipment and be able to manage bladder with I/O  cath   Outcome: Progressing   Problem: RH SKIN INTEGRITY Goal: RH STG SKIN FREE OF INFECTION/BREAKDOWN Description Pt will remain free from skin breakdown while in rehab with min assist  Outcome: Progressing Goal: RH STG MAINTAIN SKIN INTEGRITY WITH ASSISTANCE Description STG Maintain Skin Integrity With min Assistance.  Outcome: Progressing   Problem: RH SAFETY Goal: RH STG ADHERE TO SAFETY PRECAUTIONS W/ASSISTANCE/DEVICE Description STG Adhere to Safety Precautions With min Assistance/Device.  Outcome: Progressing   Problem: RH PAIN MANAGEMENT Goal: RH STG PAIN MANAGED AT OR BELOW PT'S PAIN GOAL Description Pain will be managed at a goal set of less than 3 out 10 with PRN pain med with mod  I assist  Outcome: Progressing   Problem: RH KNOWLEDGE DEFICIT SCI Goal: RH STG INCREASE KNOWLEDGE OF SELF CARE AFTER SCI Description Pt will be able to verbalize self care with Mod I assist  Outcome: Progressing   

## 2018-03-26 NOTE — Plan of Care (Signed)
  Problem: RH SAFETY Goal: RH STG ADHERE TO SAFETY PRECAUTIONS W/ASSISTANCE/DEVICE Description STG Adhere to Safety Precautions With min Assistance/Device.  Outcome: Progressing  Call light within reach, bed/chair alarm, proper footwear.  

## 2018-03-27 ENCOUNTER — Inpatient Hospital Stay (HOSPITAL_COMMUNITY): Payer: BLUE CROSS/BLUE SHIELD | Admitting: Occupational Therapy

## 2018-03-27 ENCOUNTER — Inpatient Hospital Stay (HOSPITAL_COMMUNITY): Payer: BLUE CROSS/BLUE SHIELD | Admitting: Physical Therapy

## 2018-03-27 MED ORDER — OXYBUTYNIN CHLORIDE 5 MG PO TABS
5.0000 mg | ORAL_TABLET | Freq: Three times a day (TID) | ORAL | Status: DC
  Administered 2018-03-27 – 2018-04-02 (×19): 5 mg via ORAL
  Filled 2018-03-27 (×19): qty 1

## 2018-03-27 NOTE — Progress Notes (Signed)
Physical Therapy Session Note  Patient Details  Name: Erik [name] MRN: [id number] Date of Birth: 1965/10/08  Today's Date: 03/27/2018 PT Individual Time: 0900-1010 PT Individual Time Calculation (min): 70 min   Short Term Goals: Week 3:  PT Short Term Goal 1 (Week 3): Pt will perform bed mobility with SBA consistently PT Short Term Goal 2 (Week 3): Pt will complete least restrictive transfer with SBA consistently PT Short Term Goal 3 (Week 3): Pt will demonstrate independence with home stretching program  Skilled Therapeutic Interventions/Progress Updates:    Patient received up in [facility], very pleasant and motivated to participate in PT. Able to independently self-propel WC approximately 136f, also able to manage WC parts with occasional cues and extended time today as well. [facility] with min guard for safety, transfer from sit to supine with min guard and worked on stretching R LE due to known precautions L LE. Patient however asked PT to clarify precautions as he has heard different things; will discuss with primary PT, however per recent MD notes it appears restrictions appear to have been possibly lifted/updated. Focused majority of session on core strength and sitting balance including isometric multidirectional core exercises and activities including dynamic reaching with B UEs within spine precautions, he required Min guard-MinA to maintain balance with dynamic reaching activities. [facility] to [facility], again required occasional cues and extended time for [facility]. Able to self-propel back to his room with independence, and was left up in his chair with all needs met this morning.   Therapy Documentation Precautions:  Precautions Precautions: Back Precaution Comments: TLSO worn when OOB Required Braces or Orthoses: Spinal Brace Spinal Brace: Thoracolumbosacral orthotic, Applied in sitting position Restrictions Weight  Bearing Restrictions: No General:   Vital Signs:  Pain: Pain Assessment Pain Scale: 0-10 Pain Score: 2  Pain Type: Acute pain Pain Location: Rib cage Pain Orientation: Right;Left Pain Descriptors / Indicators: Aching Pain Frequency: Constant Pain Onset: On-going Patients Stated Pain Goal: 0 Pain Intervention(s): Ambulation/increased activity;Repositioned;Emotional support Multiple Pain Sites: No    Therapy/Group: Individual Therapy  KDeniece ReePT, DPT, CBIS  Supplemental Physical Therapist [facility]   Pager [phone] Rehab Office [phone]  03/27/2018, 10:35 AM

## 2018-03-27 NOTE — Progress Notes (Signed)
Occupational Therapy Session Note  Patient Details  Name: Erik [name] Hieronymus MRN: [id number] Date of Birth: Jul 05, 1965  Today's Date: 03/27/2018 OT Individual Time: [id number] OT Individual Time Calculation (min): 60 min    Short Term Goals: Week 2:  OT Short Term Goal 1 (Week 2): Pt will perform toileting with mod A on BSC. OT Short Term Goal 2 (Week 2): Pt will come into long sitting position from flat surface without use of bed rails and min guard for balance.  OT Short Term Goal 3 (Week 2): Pt will utilize circle sitting and long sitting positions with close supervision for balance while performing LB self care without use of bedrail.  Skilled Therapeutic Interventions/Progress Updates:    Pt seen for OT ADL bathing/dressing session. Pt in supine upon arrival, agreeable to tx session and denying pain.Reports bowel program already completed this morning. Pt requesting to complete bathing/dressing. He was able to independently direct caregiver for all needed items to complete bathing/dressing from bed level.Assist for B LEs. Pt under impression no ROM in L LE due to fx. Per MD note on 12/24, no ROM restrictions. Pt made aware. Able to bring LEs into modified circle sit position with HOB raised in order to thread pants and don B socks. HE was able to don leg loops with set-up. UB bathing/dressing completed with set-up assist.  He transferred to sitting EOB with supervision using hospital bed functions. Able to direct set-up of w/c and steps for sliding board transfer into w/c, completed with set-up and assist to stabilize equipment. Pt left seated in w/c at end of session, set-up with meal tray and all needs in reach.  Throughout session, education provided regarding skin protection, DME, continuum of care, modified ADLs and bowel/bladder and d/c planning.   Therapy Documentation Precautions:  Precautions Precautions: Back Precaution Comments: TLSO worn when OOB Required Braces or Orthoses: Spinal  Brace Spinal Brace: Thoracolumbosacral orthotic, Applied in sitting position Restrictions Weight Bearing Restrictions: No Pain:   No/denies pain   Therapy/Group: Individual Therapy  Tityana Pagan L 03/27/2018, 7:13 AM

## 2018-03-27 NOTE — Plan of Care (Signed)
  Problem: Consults Goal: RH SPINAL CORD INJURY PATIENT EDUCATION Description  See Patient Education module for education specifics.  Outcome: Progressing   Problem: SCI BOWEL ELIMINATION Goal: RH STG MANAGE BOWEL WITH ASSISTANCE Description STG Manage Bowel with  Min Assistance.  Outcome: Progressing Goal: RH STG SCI MANAGE BOWEL WITH MEDICATION WITH ASSISTANCE Description STG SCI Manage bowel with medication with Min assistance.  Outcome: Progressing Goal: RH STG MANAGE BOWEL W/EQUIPMENT W/ASSISTANCE Description STG Manage Bowel With Equipment With Min Assistance  Outcome: Progressing Goal: RH STG SCI MANAGE BOWEL PROGRAM W/ASSIST OR AS APPROPRIATE Description STG SCI Manage bowel program w/min assist or as appropriate.  Outcome: Progressing   Problem: SCI BLADDER ELIMINATION Goal: RH STG MANAGE BLADDER WITH ASSISTANCE Description STG Manage Bladder With Min Assistance  Outcome: Progressing Goal: RH STG MANAGE BLADDER WITH EQUIPMENT WITH ASSISTANCE Description STG Manage Bladder With Equipment With min  Assistance  Outcome: Progressing Goal: RH STG SCI MANAGE BLADDER PROGRAM W/ASSISTANCE Description Pt will be Mod I with equipment and be able to manage bladder with I/O  cath   Outcome: Progressing   Problem: RH SKIN INTEGRITY Goal: RH STG SKIN FREE OF INFECTION/BREAKDOWN Description Pt will remain free from skin breakdown while in rehab with min assist  Outcome: Progressing Goal: RH STG MAINTAIN SKIN INTEGRITY WITH ASSISTANCE Description STG Maintain Skin Integrity With min Assistance.  Outcome: Progressing   Problem: RH SAFETY Goal: RH STG ADHERE TO SAFETY PRECAUTIONS W/ASSISTANCE/DEVICE Description STG Adhere to Safety Precautions With min Assistance/Device.  Outcome: Progressing   Problem: RH PAIN MANAGEMENT Goal: RH STG PAIN MANAGED AT OR BELOW PT'S PAIN GOAL Description Pain will be managed at a goal set of less than 3 out 10 with PRN pain med with mod  I assist  Outcome: Progressing   Problem: RH KNOWLEDGE DEFICIT SCI Goal: RH STG INCREASE KNOWLEDGE OF SELF CARE AFTER SCI Description Pt will be able to verbalize self care with Mod I assist  Outcome: Progressing   

## 2018-03-27 NOTE — Progress Notes (Signed)
Erik [name] PHYSICAL MEDICINE & REHABILITATION PROGRESS NOTE   Subjective/Complaints: No new issues. Bowel program effective. Still having  Urinary incontinence  ROS: Patient denies fever, rash, sore throat, blurred vision, nausea, vomiting, diarrhea, cough, shortness of breath or chest pain, joint or back pain, headache, or mood change.    Objective:   No results found. No results for input(s): WBC, HGB, HCT, PLT in the last 72 hours. No results for input(s): NA, K, CL, CO2, GLUCOSE, BUN, CREATININE, CALCIUM in the last 72 hours.  Intake/Output Summary (Last 24 hours) at 03/27/2018 0911 Last data filed at 03/27/2018 0858 Gross per 24 hour  Intake 480 ml  Output 900 ml  Net -420 ml     Physical Exam: Vital Signs Blood pressure (!) 123/94, pulse 80, temperature 98.3 F (36.8 C), temperature source Oral, resp. rate 18, weight 76 kg, SpO2 100 %. Constitutional: No distress . Vital signs reviewed. HEENT: EOMI, oral membranes moist Neck: supple Cardiovascular: RRR without murmur. No JVD    Respiratory: CTA Bilaterally without wheezes or rales. Normal effort    GI: BS +, non-tender, non-distended  Musculoskeletal: He exhibits edema. He exhibits no tenderness.   left knee effusion Neurological: He is alert and oriented to person, place, and time.  Motor: Bilateral upper extremities: 5/5 proximal to distal Bilateral lower extremities: 0/5 proximal distal--no motor changes in the lower extremities Sensory diminished below T10, some residual at level of injury, 0/2 below. No resting tone.   -NO motor and sensory changes Skin: Skin is warm and dry. He is not diaphoretic.  Back incision CDI with glue/tape in place--stable Psychiatric: pleasant     Assessment/Plan: 1. Functional deficits secondary to thoracic spinal cord injury which require 3+ hours per day of interdisciplinary therapy in a comprehensive inpatient rehab setting.  Physiatrist is providing close team supervision  and 24 hour management of active medical problems listed below.  Physiatrist and rehab team continue to assess barriers to discharge/monitor patient progress toward functional and medical goals  Care Tool:  Bathing  Bathing activity did not occur: Refused Body parts bathed by patient: Right arm, Left arm, Chest, Abdomen, Right upper leg, Face, Front perineal area, Buttocks, Left upper leg   Body parts bathed by helper: Right lower leg, Left lower leg Body parts n/a: Front perineal area   Bathing assist Assist Level: Minimal Assistance - Patient > 75%     Upper Body Dressing/Undressing Upper body dressing   What is the patient wearing?: Pull over shirt Orthosis activity level: Performed by patient  Upper body assist Assist Level: Set up assist    Lower Body Dressing/Undressing Lower body dressing      What is the patient wearing?: Pants     Lower body assist Assist for lower body dressing: Set up assist     Toileting Toileting Toileting Activity did not occur (Clothing management and hygiene only): N/A (no void or bm)  Toileting assist Assist for toileting: Minimal Assistance - Patient > 75%     Transfers Chair/bed transfer  Transfers assist     Chair/bed transfer assist level: Contact Guard/Touching assist     Locomotion Ambulation   Ambulation assist   Ambulation activity did not occur: Safety/medical concerns          Walk 10 feet activity   Assist  Walk 10 feet activity did not occur: Safety/medical concerns        Walk 50 feet activity   Assist Walk 50 feet with 2 turns activity did  not occur: Safety/medical concerns         Walk 150 feet activity   Assist Walk 150 feet activity did not occur: Safety/medical concerns         Walk 10 feet on uneven surface  activity   Assist Walk 10 feet on uneven surfaces activity did not occur: Safety/medical concerns         Wheelchair     Assist Will patient use wheelchair at  discharge?: Yes Type of Wheelchair: Manual    Wheelchair assist level: Independent Max wheelchair distance: [id number]     Wheelchair 50 feet with 2 turns activity    Assist        Assist Level: Independent   Wheelchair 150 feet activity     Assist     Assist Level: Independent     Medical Problem List and Plan: 1.  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  DVT Prophylaxis/Anticoagulation: Pharmaceutical: Lovenox bid x 3 months.   -dopplers were negative  3. Pain Management: oxycodone prn.  4. Mood: continue to provide positive reinforcement  5. Neuropsych: This patient is capable of making decisions on his own behalf. 6. Skin/Wound Care: routine pressure relief measures.  7. Fluids/Electrolytes/Nutrition: encourage PO         8. ABLA:  F/u hgb 10.7 12/16 9. Neurogenic bladder: I/O cath every 4-6 hours to keep volumes less than 350 cc.  follow volumes and patterns.   -still having a lot of incontinence.  -UTI treated  -ditropan trial  -condom cath 10 Neurogenic bowel:    -continue daily suppository - has had BMs ~6am x last 2 ams   -added fiber and probiotic and stool is becoming more formed.  -  AM program-   -resumed senna-s at HS 11. Orthostatic symptoms?:   -continue  ACE/TEDs when up   abdominal binder if needed for hypotension.   12.  Left knee effusion-fibular fx, noted on Xray, ortho recs f/u xray in 1wk ?monday to check for displacement       LOS: 17 days A FACE TO FACE EVALUATION WAS PERFORMED  Erik [name] 03/27/2018, 9:11 AM

## 2018-03-27 NOTE — Progress Notes (Signed)
Physical Therapy Session Note  Patient Details  Name: Erik [name] MRN: [id number] Date of Birth: 1965-10-28  Today's Date: 03/27/2018 PT Individual Time: 1430-1530 PT Individual Time Calculation (min): 60 min   Short Term Goals: Week 3:  PT Short Term Goal 1 (Week 3): Pt will perform bed mobility with SBA consistently PT Short Term Goal 2 (Week 3): Pt will complete least restrictive transfer with SBA consistently PT Short Term Goal 3 (Week 3): Pt will demonstrate independence with home stretching program  Skilled Therapeutic Interventions/Progress Updates:    Patient received up in [facility], pleasant and willing to participate in therapy today. Able to independently propel WC over level surfaces distances of 1090f with B UEs, [facility] larger/steeper hill this afternoon for improved UE endurance, strength, and community access. Able to navigate moderately inclined hill with min guard for safety and steering x3. Otherwise focused on transfers to surfaces of uneven height, patient with difficulty performing transfer to 24 inch high mat table and required ModA to transfer to table; patient began to panic in middle of transfer and when at mat table and performed trunk extension/began sliding forward requiring total intervention from therapist to prevent slide to floor. Once patient stabilized and steady at EOB, worked on activities reinforcing head-hips relationship during transfers as he reports significant fear of bringing his head forward during transfers at times due to concern of falling. Able to perform transfers to and from level mat/chair with min guard and ongoing cues for improved head-hips relationship instead of laterally pulling self along board. He was able to then self-propel chair back to his room and [facility] needs met this afternoon.   Therapy Documentation Precautions:  Precautions Precautions: Back Precaution Comments: TLSO worn when  OOB Required Braces or Orthoses: Spinal Brace Spinal Brace: Thoracolumbosacral orthotic, Applied in sitting position Restrictions Weight Bearing Restrictions: No General:   Pain: Pain Assessment Pain Scale: 0-10 Pain Score: 0-No pain    Therapy/Group: Individual Therapy  KDeniece ReePT, DPT, CBIS  Supplemental Physical [facility]rapist [facility]   Pager [phone] Rehab Office [phone]  03/27/2018, 3:35 PM

## 2018-03-28 ENCOUNTER — Inpatient Hospital Stay (HOSPITAL_COMMUNITY): Payer: BLUE CROSS/BLUE SHIELD

## 2018-03-28 ENCOUNTER — Inpatient Hospital Stay (HOSPITAL_COMMUNITY): Payer: BLUE CROSS/BLUE SHIELD | Admitting: Occupational Therapy

## 2018-03-28 ENCOUNTER — Inpatient Hospital Stay (HOSPITAL_COMMUNITY): Payer: BLUE CROSS/BLUE SHIELD | Admitting: Physical Therapy

## 2018-03-28 NOTE — Progress Notes (Signed)
[facility] PHYSICAL MEDICINE & REHABILITATION PROGRESS NOTE   Subjective/Complaints: No new complaints. Has kickoff still but also retaining urine ranging from 200-300+cc  ROS: Patient denies fever, rash, sore throat, blurred vision, nausea, vomiting, diarrhea, cough, shortness of breath or chest pain, joint or back pain, headache, or mood change.   Objective:   No results found. No results for input(s): WBC, HGB, HCT, PLT in the last 72 hours. No results for input(s): NA, K, CL, CO2, GLUCOSE, BUN, CREATININE, CALCIUM in the last 72 hours.  Intake/Output Summary (Last 24 hours) at 03/28/2018 1017 Last data filed at 03/28/2018 0509 Gross per 24 hour  Intake 480 ml  Output 550 ml  Net -70 ml     Physical Exam: Vital Signs Blood pressure (!) 123/97, pulse 100, temperature 97.9 F (36.6 C), temperature source Oral, resp. rate 17, weight 76 kg, SpO2 100 %. Constitutional: No distress . Vital signs reviewed. HEENT: EOMI, oral membranes moist Neck: supple Cardiovascular: RRR without murmur. No JVD    Respiratory: CTA Bilaterally without wheezes or rales. Normal effort    GI: BS +, non-tender, non-distended  Musculoskeletal: He exhibits edema. He exhibits no tenderness.   left knee effusion present Neurological: He is alert and oriented to person, place, and time.  Motor: Bilateral upper extremities: 5/5 proximal to distal Bilateral lower extremities: 0/5 proximal distal--no motor changes in the lower extremities Sensory diminished below T10, some residual at level of injury, 0/2 below. No resting tone.   -NO motor and sensory changes Skin: Skin is warm and dry. He is not diaphoretic.  Back incision CDI with glue/tape in place--stable Psychiatric: pleasant     Assessment/Plan: 1. Functional deficits secondary to thoracic spinal cord injury which require 3+ hours per day of interdisciplinary therapy in a comprehensive inpatient rehab setting.  Physiatrist is providing close  team supervision and 24 hour management of active medical problems listed below.  Physiatrist and rehab team continue to assess barriers to discharge/monitor patient progress toward functional and medical goals  Care Tool:  Bathing  Bathing activity did not occur: Refused Body parts bathed by patient: Right arm, Left arm, Chest, Abdomen, Right upper leg, Face, Front perineal area, Buttocks, Left upper leg   Body parts bathed by helper: Right lower leg, Left lower leg Body parts n/a: Front perineal area   Bathing assist Assist Level: Minimal Assistance - Patient > 75%     Upper Body Dressing/Undressing Upper body dressing   What is the patient wearing?: Pull over shirt Orthosis activity level: Performed by patient  Upper body assist Assist Level: Set up assist    Lower Body Dressing/Undressing Lower body dressing      What is the patient wearing?: Pants     Lower body assist Assist for lower body dressing: Set up assist     Toileting Toileting Toileting Activity did not occur (Clothing management and hygiene only): N/A (no void or bm)  Toileting assist Assist for toileting: Minimal Assistance - Patient > 75%     Transfers Chair/bed transfer  Transfers assist     Chair/bed transfer assist level: Contact Guard/Touching assist     Locomotion Ambulation   Ambulation assist   Ambulation activity did not occur: Safety/medical concerns          Walk 10 feet activity   Assist  Walk 10 feet activity did not occur: Safety/medical concerns        Walk 50 feet activity   Assist Walk 50 feet with 2 turns  activity did not occur: Safety/medical concerns         Walk 150 feet activity   Assist Walk 150 feet activity did not occur: Safety/medical concerns         Walk 10 feet on uneven surface  activity   Assist Walk 10 feet on uneven surfaces activity did not occur: Safety/medical concerns         Wheelchair     Assist Will patient  use wheelchair at discharge?: Yes Type of Wheelchair: Manual    Wheelchair assist level: Independent Max wheelchair distance: [id number]     Wheelchair 50 feet with 2 turns activity    Assist        Assist Level: Independent   Wheelchair 150 feet activity     Assist     Assist Level: Independent     Medical Problem List and Plan: 1.  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  DVT Prophylaxis/Anticoagulation: Pharmaceutical: Lovenox bid x 3 months.   -dopplers were negative  3. Pain Management: oxycodone prn.  4. Mood: continue to provide positive reinforcement  5. Neuropsych: This patient is capable of making decisions on his own behalf. 6. Skin/Wound Care: routine pressure relief measures.  7. Fluids/Electrolytes/Nutrition: encourage PO         8. ABLA:  F/u hgb 10.7 12/16 9. Neurogenic bladder: I/O caths after kickoff as needed     -still having regular kickoff/ incontinence.  -UTI treated  -continue ditropan trial (5mg  tid)  -condom cath with leg bag during day? 10 Neurogenic bowel:    -continue daily suppository for AM program  -added fiber and probiotic and stool is becoming more formed.   -resumed senna-s at HS 11. Orthostatic symptoms?:   -continue  ACE/TEDs when up   abdominal binder if needed for hypotension.   12.  Left knee effusion-fibular fx, noted on Xray  -this is a non-weight bearing bone, and he's not bearing weight regardless on LLE  -will recheck xrays next week.   -no bracing needed    LOS: 18 days A FACE TO FACE EVALUATION WAS PERFORMED  Ranelle OysterZachary T Swartz 03/28/2018, 10:17 AM

## 2018-03-28 NOTE — Progress Notes (Signed)
Occupational Therapy Session Note  Patient Details  Name: Erik [name] MRN: [id number] Date of Birth: 1965-11-15  Today's Date: 03/28/2018 OT Individual Time: 1122-1206 OT Individual Time Calculation (min): 44 min   Short Term Goals: Week 2:  OT Short Term Goal 1 (Week 2): Pt will perform toileting with mod A on BSC. OT Short Term Goal 2 (Week 2): Pt will come into long sitting position from flat surface without use of bed rails and min guard for balance.  OT Short Term Goal 3 (Week 2): Pt will utilize circle sitting and long sitting positions with close supervision for balance while performing LB self care without use of bedrail.      Skilled Therapeutic Interventions/Progress Updates:    Pt greeted in w/c and ready to go. ADL needs met. He self propelled off unit to [employer] for [employer] and endurance. While in scenic environment, worked on core exercises using closed chain methods to improve anterior weight shift control during functional transfers. Educated pt to complete these exercises at home to strengthen trunk in safest manner. Also guided pt through UB stretches, including doorway and shoulder extension stretches, once again using closed chain principles. Education emphasis placed on using primary respiratory muscles vs accessory breathing muscles to also strengthen muscles of trunk. Afterwards he self propelled back to unit. Provided him with light yellow theraband HEP for room use, per pt request. Pt able to exhibit carryover of proper exercise technique using bands at end of session. Pt left in w/c with all needs.   Therapy Documentation Precautions:  Precautions Precautions: Back Precaution Comments: TLSO worn when OOB Required Braces or Orthoses: Spinal Brace Spinal Brace: Thoracolumbosacral orthotic, Applied in sitting position Restrictions Weight Bearing Restrictions: No Pain: Pt reported no pain during session    ADL:        Therapy/Group:  Individual Therapy  Jenasia Dolinar A Jondavid Schreier 03/28/2018, 12:31 PM

## 2018-03-28 NOTE — Progress Notes (Signed)
Physical Therapy Session Note  Patient Details  Name: Erik [name] MRN: [id number] Date of Birth: October 15, 1965  Today's Date: 03/28/2018 PT Individual Time: 0830-1000 PT Individual Time Calculation (min): 90 min   Short Term Goals: Week 3:  PT Short Term Goal 1 (Week 3): Pt will perform bed mobility with SBA consistently PT Short Term Goal 2 (Week 3): Pt will complete least restrictive transfer with SBA consistently PT Short Term Goal 3 (Week 3): Pt will demonstrate independence with home stretching program  Skilled Therapeutic Interventions/Progress Updates:    Pt received seated in w/c in room, agreeable to PT session. No complaints of pain. Josh Cadle, ATP here for manual w/c evaluation and assessment with patient. Slide board transfer w/c to mat table with CGA. Pt participates in w/c evaluation with measurements taken and discussion of best setup for improved independence and functional mobility, recommending lightweight manual w/c. Pt has incontinence of bowel during slide board transfer. Assisted pt back to bed. Pt is Supervision to Independent with bed mobility and rolling L/R with setup to min A for pericare and brief change following bowel accident. Slide board back to w/c with CGA, v/c for head/hips relationship. Manual w/c propulsion x 150 ft with BUE mod I. Car transfer via slide board with min A. Pt demos improved safety and ability to perform car transfer from previous sessions. Discussed practicing car transfer with patient's daughter Ysidro EvertLaPorsha this weekend or next week depending on availability. Also discussed performing "wheelies" in w/c to ascend curbs, put pt in reclined position dependently to demonstrate. Pt becomes anxious in position due to not feeling balanced, will continue with education and practice of w/c mobility. Pt left seated in w/c in room with needs in reach.  Therapy Documentation Precautions:  Precautions Precautions: Back Precaution Comments: TLSO worn when  OOB Required Braces or Orthoses: Spinal Brace Spinal Brace: Thoracolumbosacral orthotic, Applied in sitting position Restrictions Weight Bearing Restrictions: No   Therapy/Group: Individual Therapy  Peter Congoaylor Latyra Jaye, PT, DPT  03/28/2018, 12:14 PM

## 2018-03-28 NOTE — Progress Notes (Addendum)
Occupational Therapy Weekly Progress Note  Patient Details  Name: Erik [name] MRN: [id number] Date of Birth: 04-Mar-1966  Beginning of progress report period: 03/19/18 End of progress report period: 03/28/18   Patient has met 3 of 3 short term goals.   Pt continues to make excellent progress at time of report. He presently requires setup for UB self care and Min A for LB using adaptive strategies and positioning techniques. He completes drop arm BSC slideboard transfers with Min-Mod A. Education ongoing regarding BSC use for bowel program. Pt actively d/c plans with therapist collaboration during sessions and consistently exhibits high levels of participation. He reports family is in the process of modifying their home for promoting w/c access. Continue POC.    Patient continues to demonstrate the following deficits: muscle weakness and muscle paralysis, decreased cardiorespiratoy endurance, abnormal tone, unbalanced muscle activation and decreased coordination and decreased sitting balance, decreased postural control, decreased balance strategies and difficulty maintaining precautions and therefore will continue to benefit from skilled OT intervention to enhance overall performance with BADL.  Patient progressing toward long term goals..  Continue plan of care.  OT Short Term Goals Week 2:  OT Short Term Goal 1 (Week 2): Pt will perform toileting with mod A on BSC. OT Short Term Goal 1 - Progress (Week 2): Met(simulated) OT Short Term Goal 2 (Week 2): Pt will come into long sitting position from flat surface without use of bed rails and min guard for balance.  OT Short Term Goal 2 - Progress (Week 2): Met OT Short Term Goal 3 (Week 2): Pt will utilize circle sitting and long sitting positions with close supervision for balance while performing LB self care without use of bedrail. OT Short Term Goal 3 - Progress (Week 2): Met Week 3:  OT Short Term Goal 1 (Week 3): Pt will complete BSC  transfers with Min A and LRAD OT Short Term Goal 2 (Week 3): Pt will complete LB bathing with supervision assist using adaptive strategies and positioning techniques OT Short Term Goal 3 (Week 3): Pt will be able to direct ADL care to caregiver during 1 family education session        Therapy Documentation Precautions:  Precautions Precautions: Back Precaution Comments: TLSO worn when OOB Required Braces or Orthoses: Spinal Brace Spinal Brace: Thoracolumbosacral orthotic, Applied in sitting position Restrictions Weight Bearing Restrictions: No Vital Signs: Therapy Vitals Temp: 97.7 F (36.5 C) Pulse Rate: 81 Resp: 18 BP: (!) 134/91 Patient Position (if appropriate): Sitting Oxygen Therapy SpO2: 100 % O2 Device: Room Air      Therapy/Group: Individual Therapy  Erik [name] 03/28/2018, 4:59 PM

## 2018-03-28 NOTE — Plan of Care (Signed)
  Problem: Consults Goal: RH SPINAL CORD INJURY PATIENT EDUCATION Description  See Patient Education module for education specifics.  Outcome: Progressing   Problem: SCI BOWEL ELIMINATION Goal: RH STG MANAGE BOWEL WITH ASSISTANCE Description STG Manage Bowel with  Min Assistance.  Outcome: Progressing Goal: RH STG SCI MANAGE BOWEL WITH MEDICATION WITH ASSISTANCE Description STG SCI Manage bowel with medication with Min assistance.  Outcome: Progressing Goal: RH STG MANAGE BOWEL W/EQUIPMENT W/ASSISTANCE Description STG Manage Bowel With Equipment With Min Assistance  Outcome: Progressing Goal: RH STG SCI MANAGE BOWEL PROGRAM W/ASSIST OR AS APPROPRIATE Description STG SCI Manage bowel program w/min assist or as appropriate.  Outcome: Progressing   Problem: SCI BLADDER ELIMINATION Goal: RH STG MANAGE BLADDER WITH ASSISTANCE Description STG Manage Bladder With Min Assistance  Outcome: Progressing Goal: RH STG MANAGE BLADDER WITH EQUIPMENT WITH ASSISTANCE Description STG Manage Bladder With Equipment With min  Assistance  Outcome: Progressing Goal: RH STG SCI MANAGE BLADDER PROGRAM W/ASSISTANCE Description Pt will be Mod I with equipment and be able to manage bladder with I/O  cath   Outcome: Progressing   Problem: RH SKIN INTEGRITY Goal: RH STG SKIN FREE OF INFECTION/BREAKDOWN Description Pt will remain free from skin breakdown while in rehab with min assist  Outcome: Progressing Goal: RH STG MAINTAIN SKIN INTEGRITY WITH ASSISTANCE Description STG Maintain Skin Integrity With min Assistance.  Outcome: Progressing   Problem: RH SAFETY Goal: RH STG ADHERE TO SAFETY PRECAUTIONS W/ASSISTANCE/DEVICE Description STG Adhere to Safety Precautions With min Assistance/Device.  Outcome: Progressing   Problem: RH PAIN MANAGEMENT Goal: RH STG PAIN MANAGED AT OR BELOW PT'S PAIN GOAL Description Pain will be managed at a goal set of less than 3 out 10 with PRN pain med with mod  I assist  Outcome: Progressing   Problem: RH KNOWLEDGE DEFICIT SCI Goal: RH STG INCREASE KNOWLEDGE OF SELF CARE AFTER SCI Description Pt will be able to verbalize self care with Mod I assist  Outcome: Progressing   

## 2018-03-28 NOTE — Progress Notes (Signed)
Occupational Therapy Session Note  Patient Details  Name: Erik [name] MRN: [id number] Date of Birth: 12-Nov-1965  Today's Date: 03/28/2018 OT Individual Time: 0700-0810 OT Individual Time Calculation (min): 70 min    Short Term Goals: Week 1:  OT Short Term Goal 1 (Week 1): Pt will perform LB dressing with mod A for sitting balance with task.  OT Short Term Goal 1 - Progress (Week 1): Met OT Short Term Goal 2 (Week 1): Pt will perform mod A transfer onto commode/ drop arm commode chair.  OT Short Term Goal 2 - Progress (Week 1): Met OT Short Term Goal 3 (Week 1): Pt will perform bathing with mod A overall to decreased A with self care. OT Short Term Goal 3 - Progress (Week 1): Met  Skilled Therapeutic Interventions/Progress Updates:    1:1. Pt received in bed. Pt pushes up through BUE for bed pan placement despite encouragement to SB to [facility]. Pt fearful of BM during transfer as well as falling as last time board was sliding on BSC. Pt given piece of dycem to decrease slipping of board. Pt unable to void bowel this morning just gas. Pt overall set up for UB bathing and dressing and min A for LB dressing with VC for use of plastic bag over foot to don teds. Pt able to sit in long sitting/modified circle sitting to thread BLE and maintain spinal precuations. Pt rolls B to advance pants past hips. Pt slide board transfer to w/c with steadying of equipment and brushes teeth at sink with MOD I. Exited session with pt seated in w/c, call light in reach and all needs met with RN delivering medication  Therapy Documentation Precautions:  Precautions Precautions: Back Precaution Comments: TLSO worn when OOB Required Braces or Orthoses: Spinal Brace Spinal Brace: Thoracolumbosacral orthotic, Applied in sitting position Restrictions Weight Bearing Restrictions: No   Therapy/Group: Individual Therapy  [name] 03/28/2018, 8:13 AM

## 2018-03-29 ENCOUNTER — Inpatient Hospital Stay (HOSPITAL_COMMUNITY): Payer: BLUE CROSS/BLUE SHIELD | Admitting: Occupational Therapy

## 2018-03-29 NOTE — Progress Notes (Signed)
Occupational Therapy Session Note  Patient Details  Name: Erik [name] MRN: [id number] Date of Birth: Aug 26, 1965  Today's Date: 03/29/2018 OT Individual Time: 1100-1200 OT Individual Time Calculation (min): 60 min    Short Term Goals: Week 1:  OT Short Term Goal 1 (Week 1): Pt will perform LB dressing with mod A for sitting balance with task.  OT Short Term Goal 1 - Progress (Week 1): Met OT Short Term Goal 2 (Week 1): Pt will perform mod A transfer onto commode/ drop arm commode chair.  OT Short Term Goal 2 - Progress (Week 1): Met OT Short Term Goal 3 (Week 1): Pt will perform bathing with mod A overall to decreased A with self care. OT Short Term Goal 3 - Progress (Week 1): Met Week 2:  OT Short Term Goal 1 (Week 2): Pt will perform toileting with mod A on BSC. OT Short Term Goal 1 - Progress (Week 2): Met(simulated) OT Short Term Goal 2 (Week 2): Pt will come into long sitting position from flat surface without use of bed rails and min guard for balance.  OT Short Term Goal 2 - Progress (Week 2): Met OT Short Term Goal 3 (Week 2): Pt will utilize circle sitting and long sitting positions with close supervision for balance while performing LB self care without use of bedrail. OT Short Term Goal 3 - Progress (Week 2): Met  Skilled Therapeutic Interventions/Progress Updates:    1:1 Pt in bed when arrived. Engaged in bathing and dressing at bed level. Pt performed bathing 9/10 parts with setup A (reported nursing bathed bottom before this session with bowel program). Pt dressed UB and LB clothing with setup A except for TED hose and donning brief correctly. Pt able to roll in the bed with setup for brief to be donned correctly. Pt can don orthosis TLSO and abdominal binder with setup. PT came to EOB with supervision. Pt able to place the slide board with min guard properly and transfer into new loaner w/c with min guard. Pt checked in new w/c for fit and discussed with PT.  Also discussed  using a padded tub bench for toileting and toilet transfers instead of drop arm commode for improved success. Demonstrated use and process for toileting with it with lateral leans.   PT left eating lunch with family member.    Therapy Documentation Precautions:  Precautions Precautions: Back Precaution Comments: TLSO worn when OOB Required Braces or Orthoses: Spinal Brace Spinal Brace: Thoracolumbosacral orthotic, Applied in sitting position Restrictions Weight Bearing Restrictions: No Pain:  no c/o pain in session   Therapy/Group: Individual Therapy  Willeen Cass [facility] 03/29/2018, 1:35 PM

## 2018-03-29 NOTE — Progress Notes (Signed)
[place] PHYSICAL MEDICINE & REHABILITATION PROGRESS NOTE   Subjective/Complaints: Patient seen laying in bed this morning.  He states he slept well overnight but notes gas.  He also notes lower extremity edema, but states that he was sitting in a dependent position for 12 hours straight yesterday.  Educated on leg elevation.  ROS: Denies CP, SOB, N/V/D  Objective:   No results found. No results for input(s): WBC, HGB, HCT, PLT in the last 72 hours. No results for input(s): NA, K, CL, CO2, GLUCOSE, BUN, CREATININE, CALCIUM in the last 72 hours.  Intake/Output Summary (Last 24 hours) at 03/29/2018 0851 Last data filed at 03/29/2018 0118 Gross per 24 hour  Intake 480 ml  Output 600 ml  Net -120 ml     Physical Exam: Vital Signs Blood pressure 137/89, pulse (!) 104, temperature 98.5 F (36.9 C), temperature source Oral, resp. rate 18, weight 76 kg, SpO2 100 %. Constitutional: No distress . Vital signs reviewed. HENT: Normocephalic.  Atraumatic. Eyes: EOMI. No discharge. Cardiovascular: RRR. No JVD. Respiratory: CTA Bilaterally. Normal effort. GI: BS +. Non-distended. Musc: No edema or tenderness in extremities. Musculoskeletal: Lower extremity edema Neurological: He is alert and oriented  Motor: Bilateral upper extremities: 5/5 proximal to distal Bilateral lower extremities: 0/5 proximal distal Skin: Skin is warm and dry. He is not diaphoretic.  Back incision CDI  Psychiatric: pleasant   Assessment/Plan: 1. Functional deficits secondary to thoracic spinal cord injury which require 3+ hours per day of interdisciplinary therapy in a comprehensive inpatient rehab setting.  Physiatrist is providing close team supervision and 24 hour management of active medical problems listed below.  Physiatrist and rehab team continue to assess barriers to discharge/monitor patient progress toward functional and medical goals  Care Tool:  Bathing  Bathing activity did not occur:  Refused Body parts bathed by patient: Right arm, Left arm, Chest, Abdomen, Right upper leg, Face, Front perineal area, Buttocks, Left upper leg   Body parts bathed by helper: Right lower leg, Left lower leg Body parts n/a: Front perineal area   Bathing assist Assist Level: Minimal Assistance - Patient > 75%     Upper Body Dressing/Undressing Upper body dressing   What is the patient wearing?: Pull over shirt Orthosis activity level: Performed by patient  Upper body assist Assist Level: Set up assist    Lower Body Dressing/Undressing Lower body dressing      What is the patient wearing?: Pants     Lower body assist Assist for lower body dressing: Set up assist     Toileting Toileting Toileting Activity did not occur (Clothing management and hygiene only): N/A (no void or bm)  Toileting assist Assist for toileting: Minimal Assistance - Patient > 75%     Transfers Chair/bed transfer  Transfers assist     Chair/bed transfer assist level: Contact Guard/Touching assist     Locomotion Ambulation   Ambulation assist   Ambulation activity did not occur: Safety/medical concerns          Walk 10 feet activity   Assist  Walk 10 feet activity did not occur: Safety/medical concerns        Walk 50 feet activity   Assist Walk 50 feet with 2 turns activity did not occur: Safety/medical concerns         Walk 150 feet activity   Assist Walk 150 feet activity did not occur: Safety/medical concerns         Walk 10 feet on uneven surface  activity  Assist Walk 10 feet on uneven surfaces activity did not occur: Safety/medical concerns         Wheelchair     Assist Will patient use wheelchair at discharge?: Yes Type of Wheelchair: Manual    Wheelchair assist level: Independent Max wheelchair distance: 14750ft     Wheelchair 50 feet with 2 turns activity    Assist        Assist Level: Independent   Wheelchair 150 feet activity      Assist     Assist Level: Independent     Medical Problem List and Plan: 1.  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  DVT Prophylaxis/Anticoagulation: Pharmaceutical: Lovenox bid x 3 months.   -dopplers were negative  3. Pain Management: oxycodone prn.  4. Mood: continue to provide positive reinforcement  5. Neuropsych: This patient is capable of making decisions on his own behalf. 6. Skin/Wound Care: routine pressure relief measures.  7. Fluids/Electrolytes/Nutrition: encourage PO  8. ABLA: Hemoglobin 11.7 on 12/23 9. Neurogenic bladder: I/O caths after kickoff as needed     -still having regular incontinence.  -UTI treated  -continue ditropan trial (5mg  tid) 10 Neurogenic bowel:    -continue daily suppository for AM program  -added fiber and probiotic and stool is becoming more formed.   -resumed senna-s at HS 11. Orthostatic symptoms?:   -continue  ACE/TEDs when up   abdominal binder if needed for hypotension.   12.  Left knee effusion-fibular fx, noted on Xray  -will recheck xrays next week.   -no bracing needed    LOS: 19 days A FACE TO FACE EVALUATION WAS PERFORMED  Erik [name] Erik [name] Erik [name] Erik [name] 03/29/2018, 8:51 AM

## 2018-03-29 NOTE — Progress Notes (Signed)
Occupational Therapy Session Note  Patient Details  Name: Erik [name] MRN: [id number] Date of Birth: October 19, 1965  Today's Date: 03/29/2018 OT Individual Time: 1310-1351 OT Individual Time Calculation (min): 41 min    Short Term Goals: Week 2:  OT Short Term Goal 1 (Week 2): Pt will perform toileting with mod A on BSC. OT Short Term Goal 1 - Progress (Week 2): Met(simulated) OT Short Term Goal 2 (Week 2): Pt will come into long sitting position from flat surface without use of bed rails and min guard for balance.  OT Short Term Goal 2 - Progress (Week 2): Met OT Short Term Goal 3 (Week 2): Pt will utilize circle sitting and long sitting positions with close supervision for balance while performing LB self care without use of bedrail. OT Short Term Goal 3 - Progress (Week 2): Met  Skilled Therapeutic Interventions/Progress Updates: spent inittial portion of session building rapport as patient intitated sharing information regarding his function, current mobility status and his home bedroom setup.  Otherwise, he completed w/c to/fr sharecut out transfer chair sliding board partial transfer.  He stated that he likes the shower chair but that he lacks the upper body strength to transfer onto the chair without sliding back down to the uphill requirement.  The portion of the transfer he completed with close S.   He would not alow this clincician to help hold him from sliding back down into his w/c on the up movement.   Rather he stated, "I have to get stronger in my upper body so that I can do this myself."     He stated that he was ready to take a ride off the unit and to the outside with his sister.  He was left seated in his w/c with his sister present.   He      Therapy Documentation Precautions:  Precautions Precautions: Back Precaution Comments: TLSO worn when OOB Required Braces or Orthoses: Spinal Brace Spinal Brace: Thoracolumbosacral orthotic, Applied in sitting  position Restrictions Weight Bearing Restrictions: No General: General OT Amount of Missed Time: 4 Minutes(4) Vital Signs:  Pain:denied   Therapy/Group: Individual Therapy  Alfredia Ferguson [facility] 03/29/2018, 3:36 PM

## 2018-03-30 ENCOUNTER — Inpatient Hospital Stay (HOSPITAL_COMMUNITY): Payer: BLUE CROSS/BLUE SHIELD | Admitting: Occupational Therapy

## 2018-03-30 NOTE — Progress Notes (Signed)
Occupational Therapy Session Note  Patient Details  Name: Erik [name] MRN: [id number] Date of Birth: 01-Jun-1965  Today's Date: 03/30/2018 OT Group Time: 1100-1200 OT Group Time Calculation (min): 60 min  Skilled Therapeutic Interventions/Progress Updates:    Pt engaged in therapeutic w/c level dance group focusing on patient choice, UE/LE strengthening, salience, activity tolerance, and social participation. Pt was guided through various dance-based exercises involving UEs/LEs and trunk. All music was selected by group members. Emphasis placed on UE strengthening and LE mgt with use of leg loops. Pt exhibited high levels of participation throughout tx and interacted with other members, often requesting songs. Dance-based core exercises modified for precaution adherence, with pt working on controlled anterior/posterior weight shifts. His family was present and involved also, which appeared to enhance affect. At end of session he self propelled back to room.    Therapy Documentation Precautions:  Precautions Precautions: Back Precaution Comments: TLSO worn when OOB Required Braces or Orthoses: Spinal Brace Spinal Brace: Thoracolumbosacral orthotic, Applied in sitting position Restrictions Weight Bearing Restrictions: No Pain: No s/s pain  Pain Assessment Pain Scale: 0-10 Pain Score: 0-No pain ADL: ADL Upper Body Bathing: Setup Where Assessed-Upper Body Bathing: Bed level Lower Body Bathing: Minimal assistance Where Assessed-Lower Body Bathing: Bed level Upper Body Dressing: Setup Where Assessed-Upper Body Dressing: Bed level Lower Body Dressing: Minimal assistance Where Assessed-Lower Body Dressing: Bed level Toileting: Minimal assistance Where Assessed-Toileting: Bed level Toilet Transfer: Moderate assistance Toilet Transfer Method: Scientist, research (life sciences)Transfer board Toilet Transfer Equipment: Bedside commode, Drop arm bedside commode Tub/Shower Transfer: Not assessed      Therapy/Group:  Group Therapy  Corrisa Gibby A Autymn Omlor 03/30/2018, 12:31 PM

## 2018-03-30 NOTE — Progress Notes (Signed)
[facility] PHYSICAL MEDICINE & REHABILITATION PROGRESS NOTE   Subjective/Complaints: Patient seen laying in bed this morning.  He states he slept well overnight, but has a headache this morning.  He has not asked for any medications.  ROS: + Headache.  Denies CP, SOB, N/V/D  Objective:   No results found. No results for input(s): WBC, HGB, HCT, PLT in the last 72 hours. No results for input(s): NA, K, CL, CO2, GLUCOSE, BUN, CREATININE, CALCIUM in the last 72 hours.  Intake/Output Summary (Last 24 hours) at 03/30/2018 0800 Last data filed at 03/30/2018 0556 Gross per 24 hour  Intake 420 ml  Output 1500 ml  Net -1080 ml     Physical Exam: Vital Signs Blood pressure 123/84, pulse 89, temperature 98.2 F (36.8 C), temperature source Oral, resp. rate 13, weight 76 kg, SpO2 99 %. Constitutional: No distress . Vital signs reviewed. HENT: Normocephalic.  Atraumatic. Eyes: EOMI. No discharge. Cardiovascular: RRR.  No JVD. Respiratory: CTA bilaterally.  Normal effort. GI: BS +. Non-distended. Musc: No edema or tenderness in extremities. Musculoskeletal: Lower extremity edema Neurological: He is alert and oriented  Motor: Bilateral upper extremities: 5/5 proximal to distal, stable Bilateral lower extremities: 0/5 proximal distal, stable Skin: Skin is warm and dry. He is not diaphoretic.  Back incision, not examined today Psychiatric: pleasant   Assessment/Plan: 1. Functional deficits secondary to thoracic spinal cord injury which require 3+ hours per day of interdisciplinary therapy in a comprehensive inpatient rehab setting.  Physiatrist is providing close team supervision and 24 hour management of active medical problems listed below.  Physiatrist and rehab team continue to assess barriers to discharge/monitor patient progress toward functional and medical goals  Care Tool:  Bathing  Bathing activity did not occur: Refused Body parts bathed by patient: Right arm, Left arm,  Chest, Abdomen, Front perineal area, Right upper leg, Left upper leg, Right lower leg, Left lower leg, Face   Body parts bathed by helper: Buttocks Body parts n/a: Front perineal area   Bathing assist Assist Level: Set up assist     Upper Body Dressing/Undressing Upper body dressing   What is the patient wearing?: Pull over shirt, Orthosis Orthosis activity level: Performed by patient  Upper body assist Assist Level: Set up assist    Lower Body Dressing/Undressing Lower body dressing      What is the patient wearing?: Pants, Incontinence brief, Orthosis(LEg loops and left knee brace)     Lower body assist Assist for lower body dressing: Set up assist     Toileting Toileting Toileting Activity did not occur (Clothing management and hygiene only): N/A (no void or bm)  Toileting assist Assist for toileting: Minimal Assistance - Patient > 75%     Transfers Chair/bed transfer  Transfers assist     Chair/bed transfer assist level: Contact Guard/Touching assist     Locomotion Ambulation   Ambulation assist   Ambulation activity did not occur: Safety/medical concerns          Walk 10 feet activity   Assist  Walk 10 feet activity did not occur: Safety/medical concerns        Walk 50 feet activity   Assist Walk 50 feet with 2 turns activity did not occur: Safety/medical concerns         Walk 150 feet activity   Assist Walk 150 feet activity did not occur: Safety/medical concerns         Walk 10 feet on uneven surface  activity   Assist  Walk 10 feet on uneven surfaces activity did not occur: Safety/medical concerns         Wheelchair     Assist Will patient use wheelchair at discharge?: Yes Type of Wheelchair: Manual    Wheelchair assist level: Independent Max wheelchair distance: [id number]     Wheelchair 50 feet with 2 turns activity    Assist        Assist Level: Independent   Wheelchair 150 feet activity      Assist     Assist Level: Independent     Medical Problem List and Plan: 1.  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  DVT Prophylaxis/Anticoagulation: Pharmaceutical: Lovenox bid x 3 months.   -dopplers were negative  3. Pain Management: oxycodone prn.  4. Mood: continue to provide positive reinforcement  5. Neuropsych: This patient is capable of making decisions on his own behalf. 6. Skin/Wound Care: routine pressure relief measures.  7. Fluids/Electrolytes/Nutrition: encourage PO  8. ABLA: Hemoglobin 11.7 on 12/23 9. Neurogenic bladder: I/O caths as needed     -UTI treated  -continue ditropan trial (5mg  tid)  Incontinence improving 10 Neurogenic bowel:    -continue daily suppository for AM program  -added fiber and probiotic and stool is becoming more formed.   -resumed senna-s at HS  Improving schedule 11. Orthostatic symptoms?:   -continue  ACE/TEDs when up   abdominal binder if needed for hypotension, discussed use of abdominal binder when standing.   12.  Left knee effusion-fibular fx, noted on Xray  -will recheck xrays this week.   -no bracing needed    LOS: 20 days A FACE TO FACE EVALUATION WAS PERFORMED  Donnelle Rubey Karis Jubanil Teresita Fanton 03/30/2018, 8:00 AM

## 2018-03-31 ENCOUNTER — Inpatient Hospital Stay (HOSPITAL_COMMUNITY): Payer: BLUE CROSS/BLUE SHIELD

## 2018-03-31 ENCOUNTER — Inpatient Hospital Stay (HOSPITAL_COMMUNITY): Payer: BLUE CROSS/BLUE SHIELD | Admitting: Occupational Therapy

## 2018-03-31 ENCOUNTER — Inpatient Hospital Stay (HOSPITAL_COMMUNITY): Payer: BLUE CROSS/BLUE SHIELD | Admitting: Physical Therapy

## 2018-03-31 LAB — CBC
HCT: 37.4 % — ABNORMAL LOW (ref 39.0–52.0)
Hemoglobin: 12.2 g/dL — ABNORMAL LOW (ref 13.0–17.0)
MCH: 29 pg (ref 26.0–34.0)
MCHC: 32.6 g/dL (ref 30.0–36.0)
MCV: 88.8 fL (ref 80.0–100.0)
Platelets: 340 10*3/uL (ref 150–400)
RBC: 4.21 MIL/uL — ABNORMAL LOW (ref 4.22–5.81)
RDW: 12.9 % (ref 11.5–15.5)
WBC: 8 10*3/uL (ref 4.0–10.5)
nRBC: 0 % (ref 0.0–0.2)

## 2018-03-31 LAB — BASIC METABOLIC PANEL
Anion gap: 10 (ref 5–15)
BUN: 7 mg/dL (ref 6–20)
CO2: 26 mmol/L (ref 22–32)
Calcium: 9.4 mg/dL (ref 8.9–10.3)
Chloride: 104 mmol/L (ref 98–111)
Creatinine, Ser: 0.73 mg/dL (ref 0.61–1.24)
GFR calc Af Amer: >60 mL/min (ref >60)
GFR calc non Af Amer: >60 mL/min (ref >60)
GLUCOSE: 113 mg/dL — AB (ref 70–99)
Potassium: 4 mmol/L (ref 3.5–5.1)
Sodium: 140 mmol/L (ref 135–145)

## 2018-03-31 LAB — INFLUENZA PANEL BY PCR (TYPE A & B)
Influenza A By PCR: NEGATIVE
Influenza B By PCR: NEGATIVE

## 2018-03-31 MED ORDER — SORBITOL 70 % SOLN
30.0000 mL | Freq: Every day | Status: DC | PRN
  Administered 2018-03-31: 30 mL via ORAL
  Filled 2018-03-31: qty 30

## 2018-03-31 NOTE — Progress Notes (Signed)
Propping urinal to manage urine. Scans < 350cc's during night. Urine malodorous. Supp this AM, no results with dig stim, [facility]. C/O nausea, PRN compazine given at 0340. Reports feeling "locked up with gas". Declined maalox. Erik MartinezMurray, Steffany Schoenfelder A

## 2018-03-31 NOTE — Progress Notes (Signed)
Occupational Therapy Session Note  Patient Details  Name: Erik [name] Donna MRN: [id number] Date of Birth: 04-17-1965  Today's Date: 03/31/2018 OT Individual Time: 1100-1154 OT Individual Time Calculation (min): 54 min    Short Term Goals: Week 3:  OT Short Term Goal 1 (Week 3): Pt will complete BSC transfers with Min A and LRAD OT Short Term Goal 2 (Week 3): Pt will complete LB bathing with supervision assist using adaptive strategies and positioning techniques OT Short Term Goal 3 (Week 3): Pt will be able to direct ADL care to caregiver during 1 family education session   Skilled Therapeutic Interventions/Progress Updates:    Session focused on w/c propulsion at community level and B UE strengthening. Pt completed 300+ ft of w/c propulsion, over various thresholds and in/out of the elevator with occasional cueing for positioning. Pt c/o fatigue and chills this session but agreed to getting fresh air outside. While outside pt completed B UE strengthening circuit with 6lb dumbbells, cueing provided for technique and muscle activation. Pt began to c/o increased fatigue and nausea and requested to return inside. Pt was left sitting up in his w/c with RN present and aware of complaints.   Therapy Documentation Precautions:  Precautions Precautions: Back Precaution Comments: TLSO worn when OOB Required Braces or Orthoses: Spinal Brace Spinal Brace: Thoracolumbosacral orthotic, Applied in sitting position Restrictions Weight Bearing Restrictions: No Pain: Pain Assessment Pain Scale: 0-10 Pain Score: 8  Pain Type: Acute pain Pain Location: Head Pain Orientation: Right Pain Descriptors / Indicators: Aching Pain Frequency: Occasional Pain Onset: Progressive Patients Stated Pain Goal: 0 Pain Intervention(s): Medication (See eMAR)   Therapy/Group: Individual Therapy  Crissie ReeseSandra H Jaleeah Slight 03/31/2018, 12:01 PM

## 2018-03-31 NOTE — Progress Notes (Signed)
Physical Therapy Session Note  Patient Details  Name: Erik [name] MRN: [id number] Date of Birth: 11-Oct-1965  Today's Date: 03/31/2018 PT Individual Time: 1000-1100 PT Individual Time Calculation (min): 60 min   Short Term Goals: Week 3:  PT Short Term Goal 1 (Week 3): Pt will perform bed mobility with SBA consistently PT Short Term Goal 2 (Week 3): Pt will complete least restrictive transfer with SBA consistently PT Short Term Goal 3 (Week 3): Pt will demonstrate independence with home stretching program  Skilled Therapeutic Interventions/Progress Updates:    Pt received seated in bed asleep, arousable and agreeable to therapy. Pt reports feeling nauseous and has the chills this AM, RN and medical team aware. Pt feeling very fatigued but not in pain and agreeable to attempt participation. Setup A to don pants and B leg loops in bed. Pt is independent with rolling L/R with use of bedrails and use of bed functions to raise/lower HOB. Reviewed home stretching program: BLE PROM in all available planes of motion. Reviewed handout of stretches and demonstrated for patient's daughter Erik [name]. Pt's daughter is able to assist pt with stretches with verbal cues for correct hand positioning. Pt declines any OOB mobility. Pt left seated in bed with needs in reach, daughter present.  Therapy Documentation Precautions:  Precautions Precautions: Back Precaution Comments: TLSO worn when OOB Required Braces or Orthoses: Spinal Brace Spinal Brace: Thoracolumbosacral orthotic, Applied in sitting position Restrictions Weight Bearing Restrictions: No   Therapy/Group: Individual Therapy  Peter Congoaylor Roxy Mastandrea, PT, DPT  03/31/2018, 12:17 PM

## 2018-03-31 NOTE — Progress Notes (Signed)
[facility] PHYSICAL MEDICINE & REHABILITATION PROGRESS NOTE   Subjective/Complaints: Has gas, urine "flowing", RN reports odor. Pain controlled this morning,   ROS: Patient denies fever, rash, sore throat, blurred vision,  omiting, diarrhea, cough, shortness of breath or chest pain, joint or back pain,  or mood change.   Objective:   No results found. Recent Labs    03/31/18 0554  WBC 8.0  HGB 12.2*  HCT 37.4*  PLT 340   Recent Labs    03/31/18 0554  NA 140  K 4.0  CL 104  CO2 26  GLUCOSE 113*  BUN 7  CREATININE 0.73  CALCIUM 9.4    Intake/Output Summary (Last 24 hours) at 03/31/2018 0930 Last data filed at 03/31/2018 [id number] Gross per 24 hour  Intake 720 ml  Output 1600 ml  Net -880 ml     Physical Exam: Vital Signs Blood pressure (!) 143/94, pulse (!) 105, temperature 98 F (36.7 C), temperature source Oral, resp. rate 16, weight 76 kg, SpO2 100 %. Constitutional: No distress . Vital signs reviewed. HEENT: EOMI, oral membranes moist Neck: supple Cardiovascular: RRR without murmur. No JVD    Respiratory: CTA Bilaterally without wheezes or rales. Normal effort    GI: BS +, non-tender, non-distended  Musc: No edema or tenderness in extremities. Musculoskeletal: Lower extremity edema Neurological: He is alert and oriented  Motor: Bilateral upper extremities: 5/5 proximal to distal, stable Bilateral lower extremities: 0/5 proximal distal, stable Skin: Skin is warm and dry. He is not diaphoretic.  Back incision CDI Psychiatric: pleasant   Assessment/Plan: 1. Functional deficits secondary to thoracic spinal cord injury which require 3+ hours per day of interdisciplinary therapy in a comprehensive inpatient rehab setting.  Physiatrist is providing close team supervision and 24 hour management of active medical problems listed below.  Physiatrist and rehab team continue to assess barriers to discharge/monitor patient progress toward functional and medical  goals  Care Tool:  Bathing  Bathing activity did not occur: Refused Body parts bathed by patient: Right arm, Left arm, Chest, Abdomen, Front perineal area, Right upper leg, Left upper leg, Right lower leg, Left lower leg, Face, Buttocks   Body parts bathed by helper: Buttocks Body parts n/a: Front perineal area   Bathing assist Assist Level: Set up assist     Upper Body Dressing/Undressing Upper body dressing   What is the patient wearing?: Pull over shirt, Orthosis Orthosis activity level: Performed by patient  Upper body assist Assist Level: Set up assist    Lower Body Dressing/Undressing Lower body dressing      What is the patient wearing?: Pants, Incontinence brief, Orthosis     Lower body assist Assist for lower body dressing: Set up assist     Toileting Toileting Toileting Activity did not occur (Clothing management and hygiene only): N/A (no void or bm)  Toileting assist Assist for toileting: Minimal Assistance - Patient > 75%     Transfers Chair/bed transfer  Transfers assist     Chair/bed transfer assist level: Contact Guard/Touching assist     Locomotion Ambulation   Ambulation assist   Ambulation activity did not occur: Safety/medical concerns          Walk 10 feet activity   Assist  Walk 10 feet activity did not occur: Safety/medical concerns        Walk 50 feet activity   Assist Walk 50 feet with 2 turns activity did not occur: Safety/medical concerns  Walk 150 feet activity   Assist Walk 150 feet activity did not occur: Safety/medical concerns         Walk 10 feet on uneven surface  activity   Assist Walk 10 feet on uneven surfaces activity did not occur: Safety/medical concerns         Wheelchair     Assist Will patient use wheelchair at discharge?: Yes Type of Wheelchair: Manual    Wheelchair assist level: Independent Max wheelchair distance: 1650ft     Wheelchair 50 feet with 2 turns  activity    Assist        Assist Level: Independent   Wheelchair 150 feet activity     Assist     Assist Level: Independent     Medical Problem List and Plan: 1.  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  DVT Prophylaxis/Anticoagulation: Pharmaceutical: Lovenox bid x 3 months.   -dopplers were negative  3. Pain Management: oxycodone prn.  4. Mood: continue to provide positive reinforcement  5. Neuropsych: This patient is capable of making decisions on his own behalf. 6. Skin/Wound Care: routine pressure relief measures.  7. Fluids/Electrolytes/Nutrition: encourage PO   -change to lactose free diet  -simethicone for gas 8. ABLA: Hemoglobin 11.7 on 12/23 9. Neurogenic bladder: I/O caths as needed     -UTI treated--now with odor again, still frequent   -recheck ucx  -continue ditropan trial (5mg  tid)    10 Neurogenic bowel:    -continue daily suppository for AM program  -added fiber and probiotic and stool is becoming more formed.   -resumed senna-s at HS  Improving schedule 11. Orthostatic symptoms?:   -continue  ACE/TEDs when up   abdominal binder if needed for hypotension, discussed use of abdominal binder when standing.   12.  Left knee effusion-fibular fx, noted on Xray  -will recheck xrays this week.   -no bracing needed. Transfer/movement as tolerated   LOS: 21 days A FACE TO FACE EVALUATION WAS PERFORMED  Erik [name] 03/31/2018, 9:30 AM

## 2018-03-31 NOTE — Progress Notes (Signed)
Occupational Therapy Session Note  Patient Details  Name: Erik [name] MRN: [id number] Date of Birth: Jun 08, 1965  Today's Date: 03/31/2018 OT Individual Time: [id number] OT Individual Time Calculation (min): 74 min    Short Term Goals: Week 3:  OT Short Term Goal 1 (Week 3): Pt will complete BSC transfers with Min A and LRAD OT Short Term Goal 2 (Week 3): Pt will complete LB bathing with supervision assist using adaptive strategies and positioning techniques OT Short Term Goal 3 (Week 3): Pt will be able to direct ADL care to caregiver during 1 family education session   Skilled Therapeutic Interventions/Progress Updates:    Upon entering the room, pt supine in bed with daughter present for hands on family education. Pt with no c/o pain this session. Pt requesting to transfer to complete bowel program. Lateral scoot transfer onto padded tub transfer bench with OT steadying equipment only. Intermittent supervision for safety while pt seated on commode. OT educating and demonstrating how to don and doff TLSO brace with caregiver returning demonstrations. Pt unable to void and returns to bed in same manner. Set up A for bathing and dressing tasks with caregiver present and pt to verbalize techniques utilized to complete task safety. Caregiver assisting with donning B TED hose. Pt remained in bed with call bell and all needed items within reach upon exiting the room.   Therapy Documentation Precautions:  Precautions Precautions: Back Precaution Comments: TLSO worn when OOB Required Braces or Orthoses: Spinal Brace Spinal Brace: Thoracolumbosacral orthotic, Applied in sitting position Restrictions Weight Bearing Restrictions: No General:   Vital Signs: Therapy Vitals Pulse Rate: (!) 105 BP: (!) 143/94 Patient Position (if appropriate): Sitting Pain: Pain Assessment Pain Scale: 0-10 Pain Score: 0-No pain ADL: ADL Upper Body Bathing: Setup Where Assessed-Upper Body Bathing: Bed  level Lower Body Bathing: Minimal assistance Where Assessed-Lower Body Bathing: Bed level Upper Body Dressing: Setup Where Assessed-Upper Body Dressing: Bed level Lower Body Dressing: Minimal assistance Where Assessed-Lower Body Dressing: Bed level Toileting: Minimal assistance Where Assessed-Toileting: Bed level Toilet Transfer: Moderate assistance Toilet Transfer Method: Scientist, research (life sciences)Transfer board Toilet Transfer Equipment: Bedside commode, Drop arm bedside commode Tub/Shower Transfer: Not assessed Vision   Perception    Praxis   Exercises:   Other Treatments:     Therapy/Group: Individual Therapy  Alen BleacherBradsher, Jeziel Hoffmann P 03/31/2018, 9:06 AM

## 2018-04-01 ENCOUNTER — Inpatient Hospital Stay (HOSPITAL_COMMUNITY): Payer: BLUE CROSS/BLUE SHIELD | Admitting: Physical Therapy

## 2018-04-01 ENCOUNTER — Inpatient Hospital Stay (HOSPITAL_COMMUNITY): Payer: BLUE CROSS/BLUE SHIELD

## 2018-04-01 ENCOUNTER — Inpatient Hospital Stay (HOSPITAL_COMMUNITY): Payer: BLUE CROSS/BLUE SHIELD | Admitting: Occupational Therapy

## 2018-04-01 NOTE — Plan of Care (Signed)
  Problem: Consults Goal: RH SPINAL CORD INJURY PATIENT EDUCATION Description  See Patient Education module for education specifics.  Outcome: Progressing   Problem: SCI BOWEL ELIMINATION Goal: RH STG MANAGE BOWEL WITH ASSISTANCE Description STG Manage Bowel with  Min Assistance.  Outcome: Progressing Goal: RH STG SCI MANAGE BOWEL WITH MEDICATION WITH ASSISTANCE Description STG SCI Manage bowel with medication with Min assistance.  Outcome: Progressing Goal: RH STG MANAGE BOWEL W/EQUIPMENT W/ASSISTANCE Description STG Manage Bowel With Equipment With Min Assistance  Outcome: Progressing Goal: RH STG SCI MANAGE BOWEL PROGRAM W/ASSIST OR AS APPROPRIATE Description STG SCI Manage bowel program w/min assist or as appropriate.  Outcome: Progressing   Problem: SCI BLADDER ELIMINATION Goal: RH STG MANAGE BLADDER WITH ASSISTANCE Description STG Manage Bladder With Min Assistance  Outcome: Progressing Goal: RH STG MANAGE BLADDER WITH EQUIPMENT WITH ASSISTANCE Description STG Manage Bladder With Equipment With min  Assistance  Outcome: Progressing Goal: RH STG SCI MANAGE BLADDER PROGRAM W/ASSISTANCE Description Pt will be Mod I with equipment and be able to manage bladder with I/O  cath   Outcome: Progressing   Problem: RH SKIN INTEGRITY Goal: RH STG SKIN FREE OF INFECTION/BREAKDOWN Description Pt will remain free from skin breakdown while in rehab with min assist  Outcome: Progressing Goal: RH STG MAINTAIN SKIN INTEGRITY WITH ASSISTANCE Description STG Maintain Skin Integrity With min Assistance.  Outcome: Progressing   Problem: RH SAFETY Goal: RH STG ADHERE TO SAFETY PRECAUTIONS W/ASSISTANCE/DEVICE Description STG Adhere to Safety Precautions With min Assistance/Device.  Outcome: Progressing   Problem: RH PAIN MANAGEMENT Goal: RH STG PAIN MANAGED AT OR BELOW PT'S PAIN GOAL Description Pain will be managed at a goal set of less than 3 out 10 with PRN pain med with mod  I assist  Outcome: Progressing   Problem: RH KNOWLEDGE DEFICIT SCI Goal: RH STG INCREASE KNOWLEDGE OF SELF CARE AFTER SCI Description Pt will be able to verbalize self care with Mod I assist  Outcome: Progressing   

## 2018-04-01 NOTE — Progress Notes (Signed)
Physical Therapy Weekly Progress Note  Patient Details  Name: Erik [name] MRN: [id number] Date of Birth: [birth year]  Beginning of progress report period: March 25, 2018 End of progress report period: April 01, 2018  Today's Date: 04/01/2018 PT Individual Time: 1000-1100 PT Individual Time Calculation (min): 60 min   Patient has met 2 of 3 short term goals.  Pt is now able to complete bed mobility independently and completes slide board transfers with Supervision to Erik [name]. Pt is independent with home stretching program, pressure relief techniques, and with w/c mobility. Patient has had a w/c evaluation for an ultra lightweight manual w/c to d/c home with to allow for safe and functional mobility in the home and community. Pt has demonstrated good understanding and performance of management of w/c parts on loaner chair. Pt continues to make great progress towards LTGs and has been receptive to all education from therapy team.  Patient continues to demonstrate the following deficits muscle weakness and muscle paralysis, abnormal tone and unbalanced muscle activation and decreased sitting balance, decreased postural control and decreased balance strategies and therefore will continue to benefit from skilled PT intervention to increase functional independence with mobility.  Patient progressing toward long term goals..  Continue plan of care.  PT Short Term Goals Week 3:  PT Short Term Goal 1 (Week 3): Pt will perform bed mobility with SBA consistently PT Short Term Goal 1 - Progress (Week 3): Met PT Short Term Goal 2 (Week 3): Pt will complete least restrictive transfer with SBA consistently PT Short Term Goal 2 - Progress (Week 3): Progressing toward goal PT Short Term Goal 3 (Week 3): Pt will demonstrate independence with home stretching program PT Short Term Goal 3 - Progress (Week 3): Met Week 4:  PT Short Term Goal 1 (Week 4): =LTG due to ELOS  Skilled Therapeutic  Interventions/Progress Updates:    Pt received seated in w/c in room, agreeable to PT. No complaints of pain. Manual w/c propulsion x 150 ft Mod I. Education with pt about preservation of shoulder joint and improved technique for w/c propulsion. Ascend/descend ramp with w/c and close SBA for safety. Slide board transfer w/c to/from mat table with CGA to various heights. Slide board transfer w/c to low pliable surface of recliner chair with CGA. Pt demos good safety and balance with transfer with good awareness of body positioning and safe setup for transfer. Semi-reclined to long-sitting situps x 10 reps from raised therapy wedge on therapy mat. Long-sitting balance on therapy mat rolling ball back and forth with therapist with close SBA for sitting balance. Pt has two instances of LOB with no UE support but is able to catch himself with BUE. Pt left seated in w/c in room with needs in reach at end of therapy session.  Therapy Documentation Precautions:  Precautions Precautions: Back Precaution Comments: TLSO worn when OOB Required Braces or Orthoses: Spinal Brace Spinal Brace: Thoracolumbosacral orthotic, Applied in sitting position Restrictions Weight Bearing Restrictions: No   Therapy/Group: Individual Therapy  Excell Seltzer, PT, DPT  04/01/2018, 11:49 AM

## 2018-04-01 NOTE — Progress Notes (Signed)
Physical Therapy Session Note  Patient Details  Name: Erik InchesGlen Clayborn MRN: [id number] Date of Birth: 09/11/1965  Today's Date: 04/01/2018 PT Individual Time: 0937-1000 PT Individual Time Calculation (min): 23 min   Short Term Goals: Week 3:  PT Short Term Goal 1 (Week 3): Pt will perform bed mobility with SBA consistently PT Short Term Goal 2 (Week 3): Pt will complete least restrictive transfer with SBA consistently PT Short Term Goal 3 (Week 3): Pt will demonstrate independence with home stretching program  Skilled Therapeutic Interventions/Progress Updates:    Pt seated in w/c upon PT arrival, agreeable to therapy tx and denies pain. Pt propelled w/c to the sink and washed hands with supervision. Pt propelled 2 x 300 ft this session in w/c using B UEs to/from the ortho gym with supervision. Session focused on set up/performing car transfer to simulate car height for d/c. Pt set up w/c next to car and performed all w/c parts management with supervision, occasional cueing for steps/set up. Pt performed transfer into car at decline with slideboard, CGA. Pt performed transfer out of car at incline with min assist and slideboard, min cueing for techniques. Pt propelled back to room and handed off to PT for next session.   Therapy Documentation Precautions:  Precautions Precautions: Back Precaution Comments: TLSO worn when OOB Required Braces or Orthoses: Spinal Brace Spinal Brace: Thoracolumbosacral orthotic, Applied in sitting position Restrictions Weight Bearing Restrictions: No    Therapy/Group: Individual Therapy  Cresenciano GenreEmily van Schagen, PT, DPT 04/01/2018, 7:56 AM

## 2018-04-01 NOTE — Progress Notes (Signed)
Occupational Therapy Session Note  Patient Details  Name: Erik [name] MRN: [id number] Date of Birth: [birth year]  Today's Date: 04/01/2018 OT Individual Time: 0705-0829 OT Individual Time Calculation (min): 84 min    Short Term Goals: Week 3:  OT Short Term Goal 1 (Week 3): Pt will complete BSC transfers with Min A and LRAD OT Short Term Goal 2 (Week 3): Pt will complete LB bathing with supervision assist using adaptive strategies and positioning techniques OT Short Term Goal 3 (Week 3): Pt will be able to direct ADL care to caregiver during 1 family education session   Skilled Therapeutic Interventions/Progress Updates:    Upon entering the room, pt supine in bed with no c/o pain. Pt is agreeable to OT intervention and verbalized feeling unwell yesterday. Pt is lactose intolerant and accidentally ate food that upset stomach and caused him to have issues with bowel program. Bowel program performed this morning but not successful. Pt donning LB clothing , knee brace, and leg loops with increased time and use of circle sitting with set up A overall. Pt performed supine >sit with supervision and able to sit up wheelchair and place slide board with transfer into wheelchair with supervision overall for transfer. Pt obtaining all needed items from wheelchair level with increased time and supervision for grooming tasks at sink. OT discussed home set up in order to increase independence at home. Call bell and all needed items within reach upon exiting the room.   Therapy Documentation Precautions:  Precautions Precautions: Back Precaution Comments: TLSO worn when OOB Required Braces or Orthoses: Spinal Brace Spinal Brace: Thoracolumbosacral orthotic, Applied in sitting position Restrictions Weight Bearing Restrictions: No  Pain: Pain Assessment Pain Scale: 0-10 Pain Score: 7  Pain Type: Acute pain Pain Location: Rib cage Pain Orientation: Right;Left Pain Descriptors / Indicators:  Aching Pain Frequency: Intermittent Pain Onset: On-going Pain Intervention(s): Repositioned ADL: ADL Upper Body Bathing: Setup Where Assessed-Upper Body Bathing: Bed level Lower Body Bathing: Minimal assistance Where Assessed-Lower Body Bathing: Bed level Upper Body Dressing: Setup Where Assessed-Upper Body Dressing: Bed level Lower Body Dressing: Minimal assistance Where Assessed-Lower Body Dressing: Bed level Toileting: Minimal assistance Where Assessed-Toileting: Bed level Toilet Transfer: Moderate assistance Toilet Transfer Method: Scientist, research (life sciences)Transfer board Toilet Transfer Equipment: Bedside commode, Drop arm bedside commode Tub/Shower Transfer: Not assessed   Therapy/Group: Individual Therapy  Alen BleacherBradsher, Kalee Mcclenathan P 04/01/2018, 9:43 AM

## 2018-04-01 NOTE — Progress Notes (Addendum)
Physical Therapy Session Note  Patient Details  Name: Erik InchesGlen Holness MRN: [id number] Date of Birth: 1965-04-11  Today's Date: 04/01/2018 PT Individual Time: 1451-1531 PT Individual Time Calculation (min): 40 min    Skilled Therapeutic Interventions/Progress Updates:  Pt received in w/c with family exiting upon PT arrival. Pt denied c/o pain and donned w/c gloves. Pt propelled w/c around unit to [employer] [place] & back, negotiating ramp with mod I. Discussed recreational activities pt enjoyed prior to admission (fishing, gardening) and discussed modifications to allow pt to continue to engage in these activities. Pt utilized BUE ergometer on level 5 x 7 minutes forwards + 5 minutes backwards with task focusing on BUE strengthening. Discussed moving objects in apartment to more easily accessible heights vs high shelves, as well as need for reacher to retreive objects from floor with pt demonstrating good awareness of inability to bend 2/2 back precautions. At end of session pt left sitting in w/c in room.   Therapy Documentation Precautions:  Precautions Precautions: Back Precaution Comments: TLSO worn when OOB Required Braces or Orthoses: Spinal Brace Spinal Brace: Thoracolumbosacral orthotic, Applied in sitting position Restrictions Weight Bearing Restrictions: No    Therapy/Group: Individual Therapy  Sandi MariscalVictoria M Adaira Centola 04/01/2018, 3:43 PM

## 2018-04-02 ENCOUNTER — Inpatient Hospital Stay (HOSPITAL_COMMUNITY): Payer: BLUE CROSS/BLUE SHIELD | Admitting: Physical Therapy

## 2018-04-02 ENCOUNTER — Inpatient Hospital Stay (HOSPITAL_COMMUNITY): Payer: BLUE CROSS/BLUE SHIELD

## 2018-04-02 LAB — URINE CULTURE: Culture: >=100000 — AB

## 2018-04-02 MED ORDER — CEPHALEXIN 250 MG PO CAPS
250.0000 mg | ORAL_CAPSULE | Freq: Once | ORAL | Status: AC
  Administered 2018-04-02: 250 mg via ORAL
  Filled 2018-04-02: qty 1

## 2018-04-02 MED ORDER — CEPHALEXIN 250 MG PO CAPS
250.0000 mg | ORAL_CAPSULE | Freq: Three times a day (TID) | ORAL | Status: DC
  Administered 2018-04-02: 250 mg via ORAL
  Filled 2018-04-02 (×3): qty 1

## 2018-04-02 NOTE — Progress Notes (Signed)
Physical Therapy Session Note  Patient Details  Name: Erik [name] MRN: [id number] Date of Birth: 1966/02/26  Today's Date: 04/02/2018 PT Individual Time: 0915-1000 PT Individual Time Calculation (min): 45 min   Short Term Goals: Week 3:  PT Short Term Goal 1 (Week 3): Pt will perform bed mobility with SBA consistently PT Short Term Goal 1 - Progress (Week 3): Met PT Short Term Goal 2 (Week 3): Pt will complete least restrictive transfer with SBA consistently PT Short Term Goal 2 - Progress (Week 3): Progressing toward goal PT Short Term Goal 3 (Week 3): Pt will demonstrate independence with home stretching program PT Short Term Goal 3 - Progress (Week 3): Met Week 4:  PT Short Term Goal 1 (Week 4): =LTG due to ELOS  Skilled Therapeutic Interventions/Progress Updates:   Pt reported feeling bad due to UTI.  No pain reported.  Pt supine in bed with urinal in place; trial without I and O cath'ing.  Pt did not realize that he had urinated, but he had produced 80 ml of yellow urine.    Pt rolled L><R to doff brief, and don a clean, smaller sized one.  PT educated pt on directing care for making sure brief is snug, and high up into groin to prevent leaks.  PT role- played pt asking NTs  for size medium, white brief; pt improved with practice.  PT placed note over bed requesting white briefs.  Pt performed self-stretching using leg loops and bed features. Pt able to stretch bil hamstrings if he lowers HOB slightly.  PT urged pt to do QD stretching even on days when he does not feel well.  Pt left resting in bed with needs at hand and bed alarm set.  PT informed Margaretha Sheffield, RN at end of session that pt had voided 80 ml.  Margaretha Sheffield stated that she has noted pt having problems remembering SCI information after education sessions with him and his dtr.     Therapy Documentation Precautions:  Precautions Precautions: Back Precaution Comments: TLSO worn when OOB Required Braces or Orthoses: Spinal  Brace Spinal Brace: Thoracolumbosacral orthotic, Applied in sitting position Restrictions Weight Bearing Restrictions: No   Pain: Pain Assessment Pain Scale: 0-10 No pain    Therapy/Group: Individual Therapy  Suleika Donavan 04/02/2018, 12:47 PM

## 2018-04-02 NOTE — Progress Notes (Signed)
Physical Therapy Session Note  Patient Details  Name: Erik [name] MRN: [id number] Date of Birth: 10-05-1965  Today's Date: 04/02/2018 PT Individual Time: 0802-0902 and 1224-8250 PT Individual Time Calculation (min): 60 min and 45 min  Short Term Goals: Week 4:  PT Short Term Goal 1 (Week 4): =LTG due to ELOS  Skilled Therapeutic Interventions/Progress Updates: Pt presented on BSC attempting to have BM (unsuccessful). Performed lateral scoot back to bed requiring minA and increased time as bare skin sticking to [facility]. Once in bed pt performed rolling L/R with intermittent minA for LE management while PTA donned brief. Pt donned pants supervision however noted that paper scrubs were too small and pt had increased difficulty pulling over hips while rolling. Once pt came to sitting at EOB pt able to lift buttocks and PTA assisted with pulling pants over buttock. Performed SB transfer to w/c with set up. Pt then worked on general conditioning via w/c propulsion in standard w/c to [employer]. Pt required x 1 rest break due to fatigue. Pt returned to room and remained in w/c at end of session with current needs met.   Tx2: Pt presented in w/c agreeable to therapy. Session focused on BUE strengthening. Pt propelled to rehab gym and participated in the following: Bicep curls 6lb 3 x 10  Triceps extension 4lb 3 x 10 Shoulder flexion for anterior deltoid 4lb 3 x 10 Overhead press 3lb dowel 3 x 10 Forward chest press with back away from chair back 1lb dowel 3 x 10  Catch with 1Kg ball x 15  Also discussed with pt benefits of support groups and progression of strengthening after d/c. Pt returned to room at end of session and left with needs met.      Therapy Documentation Precautions:  Precautions Precautions: Back Precaution Comments: TLSO worn when OOB Required Braces or Orthoses: Spinal Brace Spinal Brace: Thoracolumbosacral orthotic, Applied in sitting position Restrictions Weight  Bearing Restrictions: No General:   Vital Signs:   Pain: Pain Assessment Pain Scale: 0-10 Pain Score: 3  Pain Type: Acute pain Pain Location: Head Pain Descriptors / Indicators: Aching Pain Frequency: Occasional Pain Onset: Gradual Patients Stated Pain Goal: 0 Pain Intervention(s): Medication (See eMAR)   Therapy/Group: Individual Therapy  Merryl Buckels  Carney Saxton, PTA  04/02/2018, 12:52 PM

## 2018-04-02 NOTE — Progress Notes (Signed)
Physical Therapy Session Note  Patient Details  Name: Erik [name] MRN: [id number] Date of Birth: 1966/02/08  Today's Date: 04/02/2018 PT Individual Time: 0915-1000 PT Individual Time Calculation (min): 45 min   Short Term Goals: Week 4:  PT Short Term Goal 1 (Week 4): =LTG due to ELOS  Skilled Therapeutic Interventions/Progress Updates:    Pt received seated in w/c in room, agreeable to PT. No complaints of pain. Pt reports having had a bowel accident in loaner w/c and is seated in previously set up hospital w/c Pt is setup A to clean loaner w/c. Manual w/c propulsion x 150 ft with w/c and Mod I. Slide board transfer w/c to mat table going slightly uphill with close SBA. Seated block pushups 2 x 10 reps with focus on breathing technique during pushups to prevent Valsalva maneuver. Pt demos good ability to clear buttocks with block pushups. Lateral scoot transfer mat table to w/c going slightly downhill without use of SB, min A for trunk control and anterior weight shifting. Pt left seated in w/c in room with needs in reach at end of therapy session.  Therapy Documentation Precautions:  Precautions Precautions: Back Precaution Comments: TLSO worn when OOB Required Braces or Orthoses: Spinal Brace Spinal Brace: Thoracolumbosacral orthotic, Applied in sitting position Restrictions Weight Bearing Restrictions: No   Therapy/Group: Individual Therapy  Peter Congo, PT, DPT  04/02/2018, 12:09 PM

## 2018-04-03 ENCOUNTER — Inpatient Hospital Stay (HOSPITAL_COMMUNITY): Payer: BLUE CROSS/BLUE SHIELD | Admitting: Occupational Therapy

## 2018-04-03 ENCOUNTER — Encounter (HOSPITAL_COMMUNITY): Payer: BLUE CROSS/BLUE SHIELD | Admitting: *Deleted

## 2018-04-03 ENCOUNTER — Inpatient Hospital Stay (HOSPITAL_COMMUNITY): Payer: BLUE CROSS/BLUE SHIELD

## 2018-04-03 ENCOUNTER — Inpatient Hospital Stay (HOSPITAL_COMMUNITY): Payer: BLUE CROSS/BLUE SHIELD | Admitting: Physical Therapy

## 2018-04-03 DIAGNOSIS — B9620 Unspecified Escherichia coli [E. coli] as the cause of diseases classified elsewhere: Secondary | ICD-10-CM

## 2018-04-03 LAB — BASIC METABOLIC PANEL
Anion gap: 10 (ref 5–15)
BUN: 8 mg/dL (ref 6–20)
CO2: 28 mmol/L (ref 22–32)
Calcium: 9.2 mg/dL (ref 8.9–10.3)
Chloride: 98 mmol/L (ref 98–111)
Creatinine, Ser: 0.93 mg/dL (ref 0.61–1.24)
GFR calc Af Amer: >60 mL/min (ref >60)
GFR calc non Af Amer: >60 mL/min (ref >60)
Glucose, Bld: 119 mg/dL — ABNORMAL HIGH (ref 70–99)
POTASSIUM: 3.7 mmol/L (ref 3.5–5.1)
Sodium: 136 mmol/L (ref 135–145)

## 2018-04-03 LAB — CBC
HCT: 34.9 % — ABNORMAL LOW (ref 39.0–52.0)
Hemoglobin: 11.6 g/dL — ABNORMAL LOW (ref 13.0–17.0)
MCH: 29.7 pg (ref 26.0–34.0)
MCHC: 33.2 g/dL (ref 30.0–36.0)
MCV: 89.3 fL (ref 80.0–100.0)
Platelets: 302 10*3/uL (ref 150–400)
RBC: 3.91 MIL/uL — ABNORMAL LOW (ref 4.22–5.81)
RDW: 12.8 % (ref 11.5–15.5)
WBC: 3.2 10*3/uL — AB (ref 4.0–10.5)
nRBC: 0 % (ref 0.0–0.2)

## 2018-04-03 MED ORDER — PANTOPRAZOLE SODIUM 40 MG IV SOLR
40.0000 mg | Freq: Two times a day (BID) | INTRAVENOUS | Status: DC
  Administered 2018-04-03 – 2018-04-05 (×3): 40 mg via INTRAVENOUS
  Filled 2018-04-03 (×3): qty 40

## 2018-04-03 MED ORDER — SODIUM CHLORIDE 0.9 % IV SOLN
75.0000 mL/h | INTRAVENOUS | Status: DC
  Administered 2018-04-03: 75 mL/h via INTRAVENOUS

## 2018-04-03 MED ORDER — NITROFURANTOIN MONOHYD MACRO 100 MG PO CAPS
100.0000 mg | ORAL_CAPSULE | Freq: Two times a day (BID) | ORAL | Status: DC
  Filled 2018-04-03: qty 1

## 2018-04-03 MED ORDER — CIPROFLOXACIN IN D5W 200 MG/100ML IV SOLN
200.0000 mg | Freq: Two times a day (BID) | INTRAVENOUS | Status: DC
  Administered 2018-04-03 – 2018-04-06 (×7): 200 mg via INTRAVENOUS
  Filled 2018-04-03 (×8): qty 100

## 2018-04-03 NOTE — Progress Notes (Addendum)
Occupational Therapy Note MAKEUP SESSION  Patient Details  Name: Erik [name] MRN: [id number] Date of Birth: 1965-07-08  Today's Date: 04/03/2018 OT Missed Time: 45 Minutes Missed Time Reason: Patient ill (comment)  Pt in bed stating he felt very unwell. MD aware.  Pt declined participating in therapy.   Erik [name] 04/03/2018, 8:40 AM

## 2018-04-03 NOTE — Progress Notes (Signed)
[place] PHYSICAL MEDICINE & REHABILITATION PROGRESS NOTE   Subjective/Complaints: Pt feels nauseas, vomited last night. Doesn't feel up to doing therapy today. No BM this morning per patient although RN reports mucousy bm.  Ate little yesterday  ROS: Patient denies fever, rash, sore throat, blurred vision,  diarrhea, cough, shortness of breath or chest pain, joint or back pain, headache, or mood change.    Objective:   No results found. No results for input(s): WBC, HGB, HCT, PLT in the last 72 hours. No results for input(s): NA, K, CL, CO2, GLUCOSE, BUN, CREATININE, CALCIUM in the last 72 hours.  Intake/Output Summary (Last 24 hours) at 04/03/2018 0936 Last data filed at 04/03/2018 0905 Gross per 24 hour  Intake -  Output 900 ml  Net -900 ml     Physical Exam: Vital Signs Blood pressure (!) 138/94, pulse 88, temperature 98 F (36.7 C), temperature source Oral, resp. rate 19, weight 76 kg, SpO2 100 %. Constitutional: No distress . Vital signs reviewed. Appears uncomfortable HEENT: EOMI, oral membranes moist Neck: supple Cardiovascular: RRR without murmur. No JVD    Respiratory: CTA Bilaterally without wheezes or rales. Normal effort    GI: BS +, non-tender, non-distended  Musc: No edema or tenderness in extremities. Musculoskeletal: Lower extremity edema Neurological: He is alert and oriented  Motor: Bilateral upper extremities: 5/5 proximal to distal, no change Bilateral lower extremities: 0/5 proximal distal, no change Skin: Skin is warm and dry. He is not diaphoretic.  Back incision remains CDI Psychiatric: pleasant   Assessment/Plan: 1. Functional deficits secondary to thoracic spinal cord injury which require 3+ hours per day of interdisciplinary therapy in a comprehensive inpatient rehab setting.  Physiatrist is providing close team supervision and 24 hour management of active medical problems listed below.  Physiatrist and rehab team continue to assess barriers  to discharge/monitor patient progress toward functional and medical goals  Care Tool:  Bathing  Bathing activity did not occur: Refused Body parts bathed by patient: Right arm, Left arm, Chest, Abdomen, Front perineal area, Right upper leg, Left upper leg, Right lower leg, Left lower leg, Face, Buttocks   Body parts bathed by helper: Buttocks Body parts n/a: Front perineal area   Bathing assist Assist Level: Set up assist     Upper Body Dressing/Undressing Upper body dressing   What is the patient wearing?: Pull over shirt, Orthosis Orthosis activity level: Performed by patient  Upper body assist Assist Level: Set up assist    Lower Body Dressing/Undressing Lower body dressing      What is the patient wearing?: Pants, Incontinence brief, Orthosis     Lower body assist Assist for lower body dressing: Set up assist     Toileting Toileting Toileting Activity did not occur (Clothing management and hygiene only): N/A (no void or bm)  Toileting assist Assist for toileting: Minimal Assistance - Patient > 75%     Transfers Chair/bed transfer  Transfers assist     Chair/bed transfer assist level: Supervision/Verbal cueing     Locomotion Ambulation   Ambulation assist   Ambulation activity did not occur: Safety/medical concerns          Walk 10 feet activity   Assist  Walk 10 feet activity did not occur: Safety/medical concerns        Walk 50 feet activity   Assist Walk 50 feet with 2 turns activity did not occur: Safety/medical concerns         Walk 150 feet activity  Assist Walk 150 feet activity did not occur: Safety/medical concerns         Walk 10 feet on uneven surface  activity   Assist Walk 10 feet on uneven surfaces activity did not occur: Safety/medical concerns         Wheelchair     Assist Will patient use wheelchair at discharge?: Yes Type of Wheelchair: Manual    Wheelchair assist level: Independent Max  wheelchair distance: 150 ft     Wheelchair 50 feet with 2 turns activity    Assist        Assist Level: Independent   Wheelchair 150 feet activity     Assist     Assist Level: Independent     Medical Problem List and Plan: 1.  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  DVT Prophylaxis/Anticoagulation: Pharmaceutical: Lovenox bid x 3 months.   -dopplers were negative  3. Pain Management: oxycodone prn.  4. Mood: continue to provide positive reinforcement  5. Neuropsych: This patient is capable of making decisions on his own behalf. 6. Skin/Wound Care: routine pressure relief measures.  7. Fluids/Electrolytes/Nutrition: encourage PO   -change to lactose free diet  -simethicone for gas 8. ABLA: Hemoglobin 11.7 on 12/23 9. Neurogenic bladder: I/O caths as needed     -recurrent E coli UTI   -changed keflex to macrobid d/t below   -ditropan stopped    10 Neurogenic bowel:    -continue daily suppository for AM program  -added fiber and probiotic and stool is becoming more formed.   -resumed senna-s at HS  Improving schedule 11. Orthostatic symptoms?:   -continue  ACE/TEDs when up   abdominal binder if needed for hypotension, discussed use of abdominal binder when standing.   12.  Left knee effusion-fibular fx, noted on Xray  -will recheck xrays this week.   -no bracing needed. Transfer/movement as tolerated 13. Nausea:  -check KUB to make sure he's emptying  -change abx to macrobid  -treat supportively  -push fluids   LOS: 24 days A FACE TO FACE EVALUATION WAS PERFORMED  Ranelle Oyster 04/03/2018, 9:36 AM

## 2018-04-03 NOTE — Patient Care Conference (Signed)
Inpatient RehabilitationTeam Conference and Plan of Care Update Date: 04/02/2019   Time: 2:00 PM    Patient Name: Erik [name]      Medical Record Number: [id number]  Date of Birth: 1965/09/18 Sex: Male         Room/Bed: 4W16C/4W16C-01 Payor Info: Payor: BLUE CROSS BLUE SHIELD / Plan: BCBS OTHER / Product Type: *No Product type* /    Admitting Diagnosis: Paraplegia  Admit Date/Time:  03/10/2018  1:23 PM Admission Comments: No comment available   Primary Diagnosis:  Closed fracture of T11 vertebra with spinal cord injury (HCC) Principal Problem: Closed fracture of T11 vertebra with spinal cord injury [facility])  Patient Active Problem List   Diagnosis Date Noted  . Closed fracture of T11 vertebra with spinal cord injury (HCC) 03/10/2018  . Acute blood loss anemia   . Orthostasis   . Neurogenic bladder   . Neurogenic bowel   . Paraplegia [facility])     Expected Discharge Date: Expected Discharge Date: 04/08/18  Team Members Present: Physician leading conference: Dr. Faith Rogue Social Worker Present: Amada Jupiter, LCSW Nurse Present: Ronny Bacon, RN PT Present: Karolee Stamps, PT OT Present: Roney Mans, OT SLP Present: Colin Benton, SLP PPS Coordinator present : Fae Pippin     Current Status/Progress Goal Weekly Team Focus  Medical   Patient continues to work through medical issues and pain.  Conservative management for left fibular fracture.  Blood pressure elevated more recently  Establish consistent bowel and bladder program  Continue medical management per chart   Bowel/Bladder   LBM 12.30/2019, VOIDING  WITH BLADDER SCAN > 350 I/O CATH  patient to progress to urinate without need to cath.    Assess toileting needs q2 hrs, remain on bowel ptogram/training as order   Swallow/Nutrition/ Hydration             ADL's   S- min guard for transfers with slide board, steadying of equipment for lateral scoot to padded tub bench for bowel program, self care from bed level with  set up A  mod I overall from wheelchair level with use of slide board for functional transfers  pt/family training, d/c planning, ADL retraining, balance, strengthening   Mobility   Independent bed mobility, SBA to CGA SB transfer, mod I w/c mobility  Mod I with all transfers and w/c mobility  transfers, sitting balance, SCI edu   Communication             Safety/Cognition/ Behavioral Observations            Pain   Tramadol prn pain raining pain 7/10 on pain scale Last medicated at 11p on `03/01/18  pain to remain controlled with current regamine   Assess pain QS and prn   Skin   Back surgical incision healing well with no drainage, no indication of infection to area  Pt willl remain free of pressure ulcers.   Assess surgical area QS and prn     Rehab Goals Patient on target to meet rehab goals: Yes *See Care Plan and progress notes for long and short-term goals.     Barriers to Discharge  Current Status/Progress Possible Resolutions Date Resolved   Physician             Establish independence with bladder and bowel program.      Nursing                  PT  OT                  SLP                SW                Discharge Planning/Teaching Needs:    Home with girlfriend who can provide intermittent assist. Teaching is NA possibly if pt able to reach mod ind w/c level as he is now emptying his bladder.   Team Discussion:  Concern of possible UTI - await cultures.  No new restrictions for fib fx.  Still needed intermittent I/O caths but hopeful this will continue to resolve.  MD notes will likely recommended use of condom cath with leg bag during the day and OP follow up with urology.  Min-guard with some transfers.  Best with padded tub bench but will need to see if pt choosing to privately purchase this.  Supervision to CGA with w/c mobility.  SW reports daughter still working to secure accessible apartment.  Revisions to Treatment Plan:  NA     Continued Need for Acute Rehabilitation Level of Care: The patient requires daily medical management by a physician with specialized training in physical medicine and rehabilitation for the following conditions: Daily direction of a multidisciplinary physical rehabilitation program to ensure safe treatment while eliciting the highest outcome that is of practical value to the patient.: Yes Daily medical management of patient stability for increased activity during participation in an intensive rehabilitation regime.: Yes Daily analysis of laboratory values and/or radiology reports with any subsequent need for medication adjustment of medical intervention for : Post surgical problems;Neurological problems;Urological problems   I attest that I was present, lead the team conference, and concur with the assessment and plan of the team.   Christiann Hagerty 04/03/2018, 2:37 PM

## 2018-04-03 NOTE — Progress Notes (Signed)
Physical Therapy Note  Patient Details  Name: Erik [name] MRN: [id number] Date of Birth: May 17, 1965 Today's Date: 04/03/2018   Attempted to see patient for scheduled therapy session this AM, pt reports he just vomited all of his AM meds. RN aware and in room assisting patient with nausea/vomiting. Attempted to see pt again in PM to make up therapy time and pt off the floor for testing. Will continue per plan of care. Pt missed 60 min of scheduled skilled therapy services due to illness this date.    Peter Congo, PT, DPT  04/03/2018, 3:09 PM

## 2018-04-03 NOTE — Progress Notes (Signed)
Occupational Therapy Session Note  Patient Details  Name: Erik [name] MRN: [id number] Date of Birth: 1965/04/15  Today's Date: 04/03/2018 OT Individual Time: 1300-1330 OT Individual Time Calculation (min): 30 min  and Today's Date: 04/03/2018 OT Missed Time: 60 Minutes Missed Time Reason: Patient ill (comment)   Short Term Goals: Week 3:  OT Short Term Goal 1 (Week 3): Pt will complete BSC transfers with Min A and LRAD OT Short Term Goal 2 (Week 3): Pt will complete LB bathing with supervision assist using adaptive strategies and positioning techniques OT Short Term Goal 3 (Week 3): Pt will be able to direct ADL care to caregiver during 1 family education session   Skilled Therapeutic Interventions/Progress Updates:    Upon entering the room, pt supine in bed and continues to report feeling unwell. RN present and pt getting IV antibiotics. OT providing total A to don TED hose and B LE stretches in all planes of movement. Pt declined education at this time and reports wishing to continue to rest as he feels nauseated. B LEs placed into prafo boot since he was remaining in bed. Call bell and all needed items within reach upon exiting the room.   Therapy Documentation Precautions:  Precautions Precautions: Back Precaution Comments: TLSO worn when OOB Required Braces or Orthoses: Spinal Brace Spinal Brace: Thoracolumbosacral orthotic, Applied in sitting position Restrictions Weight Bearing Restrictions: No General: General OT Amount of Missed Time: 60 Minutes ADL: ADL Upper Body Bathing: Setup Where Assessed-Upper Body Bathing: Bed level Lower Body Bathing: Minimal assistance Where Assessed-Lower Body Bathing: Bed level Upper Body Dressing: Setup Where Assessed-Upper Body Dressing: Bed level Lower Body Dressing: Minimal assistance Where Assessed-Lower Body Dressing: Bed level Toileting: Minimal assistance Where Assessed-Toileting: Bed level Toilet Transfer: Moderate  assistance Toilet Transfer Method: Scientist, research (life sciences): Bedside commode, Drop arm bedside commode Tub/Shower Transfer: Not assessed   Therapy/Group: Individual Therapy  [name] 04/03/2018, 1:38 PM

## 2018-04-03 NOTE — Plan of Care (Signed)
  Problem: Consults Goal: RH SPINAL CORD INJURY PATIENT EDUCATION Description  See Patient Education module for education specifics.  Outcome: Progressing   Problem: SCI BOWEL ELIMINATION Goal: RH STG MANAGE BOWEL WITH ASSISTANCE Description STG Manage Bowel with  Min Assistance.  Outcome: Progressing Goal: RH STG SCI MANAGE BOWEL WITH MEDICATION WITH ASSISTANCE Description STG SCI Manage bowel with medication with Min assistance.  Outcome: Progressing Goal: RH STG MANAGE BOWEL W/EQUIPMENT W/ASSISTANCE Description STG Manage Bowel With Equipment With Min Assistance  Outcome: Progressing Goal: RH STG SCI MANAGE BOWEL PROGRAM W/ASSIST OR AS APPROPRIATE Description STG SCI Manage bowel program w/min assist or as appropriate.  Outcome: Progressing   Problem: SCI BLADDER ELIMINATION Goal: RH STG MANAGE BLADDER WITH ASSISTANCE Description STG Manage Bladder With Min Assistance  Outcome: Progressing Goal: RH STG MANAGE BLADDER WITH EQUIPMENT WITH ASSISTANCE Description STG Manage Bladder With Equipment With min  Assistance  Outcome: Progressing Goal: RH STG SCI MANAGE BLADDER PROGRAM W/ASSISTANCE Description Pt will be Mod I with equipment and be able to manage bladder with I/O  cath   Outcome: Progressing   Problem: RH SKIN INTEGRITY Goal: RH STG SKIN FREE OF INFECTION/BREAKDOWN Description Pt will remain free from skin breakdown while in rehab with min assist  Outcome: Progressing Goal: RH STG MAINTAIN SKIN INTEGRITY WITH ASSISTANCE Description STG Maintain Skin Integrity With min Assistance.  Outcome: Progressing   Problem: RH SAFETY Goal: RH STG ADHERE TO SAFETY PRECAUTIONS W/ASSISTANCE/DEVICE Description STG Adhere to Safety Precautions With min Assistance/Device.  Outcome: Progressing   Problem: RH PAIN MANAGEMENT Goal: RH STG PAIN MANAGED AT OR BELOW PT'S PAIN GOAL Description Pain will be managed at a goal set of less than 3 out 10 with PRN pain med with mod  I assist  Outcome: Progressing   Problem: RH KNOWLEDGE DEFICIT SCI Goal: RH STG INCREASE KNOWLEDGE OF SELF CARE AFTER SCI Description Pt will be able to verbalize self care with Mod I assist  Outcome: Progressing   

## 2018-04-03 NOTE — Progress Notes (Signed)
Physical Therapy Session Note  Patient Details  Name: Erik [name] MRN: [id number] Date of Birth: [birth year]  Today's Date: 04/03/2018  Skilled Therapeutic Interventions/Progress Updates:   Attempted to see pt this PM to make up missed minutes from this morning.  [place], RN with pt, giving meds.  Pt  stated that he still has N and V and cannot participate.     Therapy Documentation Precautions:  Precautions Precautions: Back Precaution Comments: TLSO worn when OOB Required Braces or Orthoses: Spinal Brace Spinal Brace: Thoracolumbosacral orthotic, Applied in sitting position Restrictions Weight Bearing Restrictions: No        Therapy/Group: Individual Therapy  Erik [name] 04/03/2018, 4:27 PM

## 2018-04-03 NOTE — Progress Notes (Signed)
Occupational Therapy Session Note  Patient Details  Name: Erik [name] MRN: [id number] Date of Birth: [birth year]  Today's Date: 04/03/2018 OT Individual Time: 0705-0729 OT Individual Time Calculation (min): 24 min  and Today's Date: 04/03/2018 OT Missed Time: 45 Minutes Missed Time Reason: Patient ill (comment)   Short Term Goals: Week 3:  OT Short Term Goal 1 (Week 3): Pt will complete BSC transfers with Min A and LRAD OT Short Term Goal 2 (Week 3): Pt will complete LB bathing with supervision assist using adaptive strategies and positioning techniques OT Short Term Goal 3 (Week 3): Pt will be able to direct ADL care to caregiver during 1 family education session   Skilled Therapeutic Interventions/Progress Updates:    Upon entering the room, pt sleeping soundly. Once alert, pt verbalized feeling unwell this morning. Bowel program has been initiated but pt declines transfer to padded tub bench in order to complete. Pt verbalized, " I just feel like I have to throw up." OT assisted with repositioning and pt requesting to rest until next therapy session after schedule was reviewed. Call bell and all needed items within reach. Bed alarm activated.    Therapy Documentation Precautions:  Precautions Precautions: Back Precaution Comments: TLSO worn when OOB Required Braces or Orthoses: Spinal Brace Spinal Brace: Thoracolumbosacral orthotic, Applied in sitting position Restrictions Weight Bearing Restrictions: No General: General OT Amount of Missed Time: 45 Minutes Vital Signs: Therapy Vitals Temp: 98 F (36.7 C) Temp Source: Oral Pulse Rate: 88 Resp: 19 BP: (!) 138/94 Patient Position (if appropriate): Sitting Oxygen Therapy SpO2: 100 % O2 Device: Room Air Pain: Pain Assessment Pain Scale: 0-10 Pain Score: 0-No pain ADL: ADL Upper Body Bathing: Setup Where Assessed-Upper Body Bathing: Bed level Lower Body Bathing: Minimal assistance Where Assessed-Lower Body Bathing: Bed  level Upper Body Dressing: Setup Where Assessed-Upper Body Dressing: Bed level Lower Body Dressing: Minimal assistance Where Assessed-Lower Body Dressing: Bed level Toileting: Minimal assistance Where Assessed-Toileting: Bed level Toilet Transfer: Moderate assistance Toilet Transfer Method: Scientist, research (life sciences): Bedside commode, Drop arm bedside commode Tub/Shower Transfer: Not assessed   Therapy/Group: Individual Therapy  [name] 04/03/2018, 7:45 AM

## 2018-04-03 NOTE — Progress Notes (Signed)
Pt observed vomiting green emesis this morning. Pt refused breakfast and PO meds stating that he feels like something is stuck in his chest. Dr Riley Kill made aware. Continue plan of care.   Courtney Fenlon W Krystyn Picking

## 2018-04-04 ENCOUNTER — Inpatient Hospital Stay (HOSPITAL_COMMUNITY): Payer: BLUE CROSS/BLUE SHIELD

## 2018-04-04 ENCOUNTER — Inpatient Hospital Stay (HOSPITAL_COMMUNITY): Payer: BLUE CROSS/BLUE SHIELD | Admitting: Occupational Therapy

## 2018-04-04 ENCOUNTER — Inpatient Hospital Stay (HOSPITAL_COMMUNITY): Payer: BLUE CROSS/BLUE SHIELD | Admitting: Physical Therapy

## 2018-04-04 DIAGNOSIS — A084 Viral intestinal infection, unspecified: Secondary | ICD-10-CM

## 2018-04-04 LAB — HEPATIC FUNCTION PANEL
ALT: 18 U/L (ref 0–44)
AST: 18 U/L (ref 15–41)
Albumin: 3.2 g/dL — ABNORMAL LOW (ref 3.5–5.0)
Alkaline Phosphatase: 64 U/L (ref 38–126)
Bilirubin, Direct: <0.1 mg/dL (ref 0.0–0.2)
Total Bilirubin: 0.3 mg/dL (ref 0.3–1.2)
Total Protein: 6.6 g/dL (ref 6.5–8.1)

## 2018-04-04 LAB — CBC
HCT: 37.2 % — ABNORMAL LOW (ref 39.0–52.0)
Hemoglobin: 12 g/dL — ABNORMAL LOW (ref 13.0–17.0)
MCH: 28.3 pg (ref 26.0–34.0)
MCHC: 32.3 g/dL (ref 30.0–36.0)
MCV: 87.7 fL (ref 80.0–100.0)
PLATELETS: 307 10*3/uL (ref 150–400)
RBC: 4.24 MIL/uL (ref 4.22–5.81)
RDW: 12.6 % (ref 11.5–15.5)
WBC: 4.9 10*3/uL (ref 4.0–10.5)
nRBC: 0 % (ref 0.0–0.2)

## 2018-04-04 LAB — BASIC METABOLIC PANEL
Anion gap: 11 (ref 5–15)
BUN: 7 mg/dL (ref 6–20)
CALCIUM: 9.1 mg/dL (ref 8.9–10.3)
CO2: 25 mmol/L (ref 22–32)
Chloride: 102 mmol/L (ref 98–111)
Creatinine, Ser: 0.85 mg/dL (ref 0.61–1.24)
GFR calc Af Amer: >60 mL/min (ref >60)
GFR calc non Af Amer: >60 mL/min (ref >60)
Glucose, Bld: 118 mg/dL — ABNORMAL HIGH (ref 70–99)
Potassium: 3.4 mmol/L — ABNORMAL LOW (ref 3.5–5.1)
SODIUM: 138 mmol/L (ref 135–145)

## 2018-04-04 LAB — LIPASE, BLOOD: Lipase: 26 U/L (ref 11–51)

## 2018-04-04 LAB — AMYLASE: Amylase: 40 U/L (ref 28–100)

## 2018-04-04 MED ORDER — ONDANSETRON HCL 4 MG PO TABS: 4.0000 mg | ORAL_TABLET | Freq: Four times a day (QID) | ORAL | Status: DC | PRN

## 2018-04-04 MED ORDER — POTASSIUM CHLORIDE IN NACL 20-0.9 MEQ/L-% IV SOLN
INTRAVENOUS | Status: DC
  Administered 2018-04-04 – 2018-04-07 (×5): via INTRAVENOUS
  Filled 2018-04-04 (×5): qty 1000

## 2018-04-04 MED ORDER — PROCHLORPERAZINE EDISYLATE 10 MG/2ML IJ SOLN: 5.0000 mg | Freq: Four times a day (QID) | INTRAMUSCULAR | Status: DC | PRN

## 2018-04-04 MED ORDER — ONDANSETRON HCL 4 MG/2ML IJ SOLN
4.0000 mg | Freq: Four times a day (QID) | INTRAMUSCULAR | Status: DC | PRN
  Administered 2018-04-04 – 2018-04-08 (×5): 4 mg via INTRAVENOUS
  Filled 2018-04-04 (×6): qty 2

## 2018-04-04 MED ORDER — PROCHLORPERAZINE MALEATE 5 MG PO TABS: 5.0000 mg | ORAL_TABLET | Freq: Four times a day (QID) | ORAL | Status: DC | PRN

## 2018-04-04 MED ORDER — PROCHLORPERAZINE 25 MG RE SUPP: 12.5000 mg | Freq: Four times a day (QID) | RECTAL | Status: DC | PRN

## 2018-04-04 NOTE — Progress Notes (Signed)
Occupational Therapy Session Note  Patient Details  Name: Erik [name] MRN: [id number] Date of Birth: 20-Dec-1965  Today's Date: 04/04/2018 OT Individual Time: 0700-0710 OT Individual Time Calculation (min): 10 min    Skilled Therapeutic Interventions/Progress Updates:    1;1. Pt in bed upon arrival awake with no c/o pain however reporting just havin episode of emesis with RN in room. Pt agreeable to brushing teeth with set up, but declines OOB/EOB intervention. Pt educated on continueing to complete bowel program despite sickness to continue training bowels/bowel schedule. Pt declines any further interventions as pt feels too "sick." Exited session with pt seated in bed, call light in reach and all needs met  Therapy Documentation Precautions:  Precautions Precautions: Back Precaution Comments: TLSO worn when OOB Required Braces or Orthoses: Spinal Brace Spinal Brace: Thoracolumbosacral orthotic, Applied in sitting position Restrictions Weight Bearing Restrictions: No General: General OT Amount of Missed Time: 65 Minutes Vital Signs: Therapy Vitals Temp: 98.3 F (36.8 C) Temp Source: Oral Pulse Rate: 83 Resp: 16 BP: (!) 162/95 Patient Position (if appropriate): Lying Oxygen Therapy SpO2: 99 % O2 Device: Room Air Pain:   ADL: ADL Upper Body Bathing: Setup Where Assessed-Upper Body Bathing: Bed level Lower Body Bathing: Minimal assistance Where Assessed-Lower Body Bathing: Bed level Upper Body Dressing: Setup Where Assessed-Upper Body Dressing: Bed level Lower Body Dressing: Minimal assistance Where Assessed-Lower Body Dressing: Bed level Toileting: Minimal assistance Where Assessed-Toileting: Bed level Toilet Transfer: Moderate assistance Toilet Transfer Method: Theatre manager: Bedside commode, Drop arm bedside commode Tub/Shower Transfer: Not assessed Vision   Perception    Praxis   Exercises:   Other Treatments:      Therapy/Group: Individual Therapy  [name] 04/04/2018, 7:12 AM

## 2018-04-04 NOTE — Progress Notes (Signed)
Social Work Patient ID: Erik [name], male   DOB: 31-Jul-1965, 53 y.o.   MRN: [id number]  Have reviewed team conference with pt and daughter.  Pt still having N/V but is pleased with plan still for d/c on Tuesday.  Daughter has been able to secure an accessible d/c location.  DME and HH arranged.  Will continue to follow.  Brettney Ficken, LCSW

## 2018-04-04 NOTE — Progress Notes (Signed)
[facility] PHYSICAL MEDICINE & REHABILITATION PROGRESS NOTE   Subjective/Complaints: Still feeling nauseas. Can't keep any food down. Has mild belly pain but it's mostly hypogastric and vague.   ROS: Patient denies fever, rash, sore throat, blurred vision,   diarrhea, cough, shortness of breath or chest pain, joint or back pain, headache, or mood change.   Objective:   Dg Abd 1 View  Result Date: 04/03/2018 CLINICAL DATA:  Nausea, vomiting, and fever. EXAM: ABDOMEN - 1 VIEW COMPARISON:  None. FINDINGS: The bowel gas pattern is normal. No radio-opaque calculi or other significant radiographic abnormality are seen.Previous thoracolumbar fusion. IMPRESSION: Benign-appearing abdomen. Electronically Signed   By: Francene BoyersJames  Maxwell M.D.   On: 04/03/2018 15:20   Recent Labs    04/03/18 1024 04/04/18 0610  WBC 3.2* 4.9  HGB 11.6* 12.0*  HCT 34.9* 37.2*  PLT 302 307   Recent Labs    04/03/18 1024 04/04/18 0610  NA 136 138  K 3.7 3.4*  CL 98 102  CO2 28 25  GLUCOSE 119* 118*  BUN 8 7  CREATININE 0.93 0.85  CALCIUM 9.2 9.1    Intake/Output Summary (Last 24 hours) at 04/04/2018 1116 Last data filed at 04/04/2018 [id number] Gross per 24 hour  Intake 1591.14 ml  Output 2 ml  Net 1589.14 ml     Physical Exam: Vital Signs Blood pressure (!) 162/95, pulse 83, temperature 98.3 F (36.8 C), temperature source Oral, resp. rate 16, weight 76 kg, SpO2 99 %. Constitutional: No distress . Vital signs reviewed. Appears uncomfortable HEENT: EOMI, oral membranes moist Neck: supple Cardiovascular: RRR without murmur. No JVD    Respiratory: CTA Bilaterally without wheezes or rales. Normal effort    GI: BS +, non-tender with palpation, no rebound, non-distended  Musc: No edema or tenderness in extremities. Musculoskeletal: Lower extremity edema Neurological: He is alert and oriented  Motor: Bilateral upper extremities: 5/5 proximal to distal, no changes Bilateral lower extremities: 0/5 proximal  distal, no changes Skin: Skin is warm and dry. He is not diaphoretic.  Back incision remains CDI Psychiatric: pleasant   Assessment/Plan: 1. Functional deficits secondary to thoracic spinal cord injury which require 3+ hours per day of interdisciplinary therapy in a comprehensive inpatient rehab setting.  Physiatrist is providing close team supervision and 24 hour management of active medical problems listed below.  Physiatrist and rehab team continue to assess barriers to discharge/monitor patient progress toward functional and medical goals  Care Tool:  Bathing  Bathing activity did not occur: Refused Body parts bathed by patient: Right arm, Left arm, Chest, Abdomen, Front perineal area, Right upper leg, Left upper leg, Right lower leg, Left lower leg, Face, Buttocks   Body parts bathed by helper: Buttocks Body parts n/a: Front perineal area   Bathing assist Assist Level: Set up assist     Upper Body Dressing/Undressing Upper body dressing   What is the patient wearing?: Pull over shirt, Orthosis Orthosis activity level: Performed by patient  Upper body assist Assist Level: Set up assist    Lower Body Dressing/Undressing Lower body dressing      What is the patient wearing?: Pants, Incontinence brief, Orthosis     Lower body assist Assist for lower body dressing: Set up assist     Toileting Toileting Toileting Activity did not occur (Clothing management and hygiene only): N/A (no void or bm)  Toileting assist Assist for toileting: Minimal Assistance - Patient > 75%     Transfers Chair/bed transfer  Transfers assist  Chair/bed transfer assist level: Supervision/Verbal cueing     Locomotion Ambulation   Ambulation assist   Ambulation activity did not occur: Safety/medical concerns          Walk 10 feet activity   Assist  Walk 10 feet activity did not occur: Safety/medical concerns        Walk 50 feet activity   Assist Walk 50 feet with  2 turns activity did not occur: Safety/medical concerns         Walk 150 feet activity   Assist Walk 150 feet activity did not occur: Safety/medical concerns         Walk 10 feet on uneven surface  activity   Assist Walk 10 feet on uneven surfaces activity did not occur: Safety/medical concerns         Wheelchair     Assist Will patient use wheelchair at discharge?: Yes Type of Wheelchair: Manual    Wheelchair assist level: Independent Max wheelchair distance: 150 ft     Wheelchair 50 feet with 2 turns activity    Assist        Assist Level: Independent   Wheelchair 150 feet activity     Assist     Assist Level: Independent     Medical Problem List and Plan: 1.  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  DVT Prophylaxis/Anticoagulation: Pharmaceutical: Lovenox bid x 3 months.   -dopplers were negative  3. Pain Management: oxycodone prn.  4. Mood: continue to provide positive reinforcement  5. Neuropsych: This patient is capable of making decisions on his own behalf. 6. Skin/Wound Care: routine pressure relief measures.  7. Fluids/Electrolytes/Nutrition: encourage PO   -change to lactose free diet  -simethicone for gas 8. ABLA: Hemoglobin 11.7 on 12/23 9. Neurogenic bladder: I/O caths as needed     -recurrent E coli UTI  -continue cipro  -ditropan stopped    10 Neurogenic bowel:    -continue daily suppository for AM program  - fiber and probiotic     -resumed senna-s at HS  -had been on schedule until recent N/V began 11. Orthostatic symptoms?:   -continue  ACE/TEDs when up   abdominal binder if needed for hypotension, discussed use of abdominal binder when standing.   12.  Left knee effusion-fibular fx, noted on Xray  -folllow up xrays demonstrate early callus formation  -no bracing needed.  Transfer/movement as tolerated 13. Nausea: ? viral gastroenteritis vs UTI/abx?  -persistent n/v   -kub reviewed and unremarkable, exam not overly impressive  -labs all reviewed this morning, potassium a little low  - amylase,lipase, LFT's all normal.  -continue NS IVF with Kcl  -push fluids and general po intake, treat supportively  -continue IV cipro (hadn't used ceftriaxone because there was concern over intolerance of keflex initially). Can switch to PO abx once he's able to hold food down.   LOS: 25 days A FACE TO FACE EVALUATION WAS PERFORMED  Ranelle Oyster 04/04/2018, 11:16 AM

## 2018-04-04 NOTE — Plan of Care (Signed)
  Problem: Consults Goal: RH SPINAL CORD INJURY PATIENT EDUCATION Description  See Patient Education module for education specifics.  Outcome: Progressing   Problem: SCI BOWEL ELIMINATION Goal: RH STG MANAGE BOWEL WITH ASSISTANCE Description STG Manage Bowel with  Min Assistance.  Outcome: Progressing Goal: RH STG SCI MANAGE BOWEL WITH MEDICATION WITH ASSISTANCE Description STG SCI Manage bowel with medication with Min assistance.  Outcome: Progressing Goal: RH STG MANAGE BOWEL W/EQUIPMENT W/ASSISTANCE Description STG Manage Bowel With Equipment With Min Assistance  Outcome: Progressing Goal: RH STG SCI MANAGE BOWEL PROGRAM W/ASSIST OR AS APPROPRIATE Description STG SCI Manage bowel program w/min assist or as appropriate.  Outcome: Progressing   Problem: SCI BLADDER ELIMINATION Goal: RH STG MANAGE BLADDER WITH ASSISTANCE Description STG Manage Bladder With Min Assistance  Outcome: Progressing Goal: RH STG MANAGE BLADDER WITH EQUIPMENT WITH ASSISTANCE Description STG Manage Bladder With Equipment With min  Assistance  Outcome: Progressing Goal: RH STG SCI MANAGE BLADDER PROGRAM W/ASSISTANCE Description Pt will be Mod I with equipment and be able to manage bladder with I/O  cath   Outcome: Progressing   Problem: RH SKIN INTEGRITY Goal: RH STG SKIN FREE OF INFECTION/BREAKDOWN Description Pt will remain free from skin breakdown while in rehab with min assist  Outcome: Progressing Goal: RH STG MAINTAIN SKIN INTEGRITY WITH ASSISTANCE Description STG Maintain Skin Integrity With min Assistance.  Outcome: Progressing   Problem: RH SAFETY Goal: RH STG ADHERE TO SAFETY PRECAUTIONS W/ASSISTANCE/DEVICE Description STG Adhere to Safety Precautions With min Assistance/Device.  Outcome: Progressing   Problem: RH PAIN MANAGEMENT Goal: RH STG PAIN MANAGED AT OR BELOW PT'S PAIN GOAL Description Pain will be managed at a goal set of less than 3 out 10 with PRN pain med with mod  I assist  Outcome: Progressing   Problem: RH KNOWLEDGE DEFICIT SCI Goal: RH STG INCREASE KNOWLEDGE OF SELF CARE AFTER SCI Description Pt will be able to verbalize self care with Mod I assist  Outcome: Progressing   

## 2018-04-04 NOTE — Progress Notes (Signed)
Occupational Therapy Session Note  Patient Details  Name: Erik [name] MRN: [id number] Date of Birth: 1966-02-04  Today's Date: 04/04/2018 OT Missed Time: 90 Minutes Missed Time Reason: Patient ill (comment)   Skilled Therapeutic Interventions/Progress Updates:    Pt greeted in bed, declining tx due to illness. 90 minute missed.   Therapy Documentation Precautions:  Precautions Precautions: Back Precaution Comments: TLSO worn when OOB Required Braces or Orthoses: Spinal Brace Spinal Brace: Thoracolumbosacral orthotic, Applied in sitting position Restrictions Weight Bearing Restrictions: No General: General OT Amount of Missed Time: 90 Minutes PT Missed Treatment Reason: Patient ill (Comment)(nausea/vomiting) Vital Signs: Therapy Vitals Temp: 98.7 F (37.1 C) Temp Source: Oral Pulse Rate: 71 Resp: 18 BP: (!) 170/89 Patient Position (if appropriate): Lying Oxygen Therapy SpO2: 99 % O2 Device: Room Air ADL: ADL Upper Body Bathing: Setup Where Assessed-Upper Body Bathing: Bed level Lower Body Bathing: Minimal assistance Where Assessed-Lower Body Bathing: Bed level Upper Body Dressing: Setup Where Assessed-Upper Body Dressing: Bed level Lower Body Dressing: Minimal assistance Where Assessed-Lower Body Dressing: Bed level Toileting: Minimal assistance Where Assessed-Toileting: Bed level Toilet Transfer: Moderate assistance Toilet Transfer Method: Scientist, research (life sciences): Bedside commode, Drop arm bedside commode Tub/Shower Transfer: Not assessed      Therapy/Group: Individual Therapy  [name] A Pansey Pinheiro 04/04/2018, 4:56 PM

## 2018-04-04 NOTE — Progress Notes (Signed)
Physical Therapy Session Note  Patient Details  Name: Erik [name] MRN: [id number] Date of Birth: 28-Apr-1965  Today's Date: 04/04/2018 PT Individual Time: 1015-1030 PT Individual Time Calculation (min): 15 min   Short Term Goals: Week 4:  PT Short Term Goal 1 (Week 4): =LTG due to ELOS  Skilled Therapeutic Interventions/Progress Updates:    Pt received seated in bed, reports ongoing nausea and vomiting and declines any OOB mobility. Pt also declines to perform any bed level stretches due to feeling ill. Education with patient about home setup, navigating doorways in w/c, etc. Discussed practicing car transfer with patient's daughter Ysidro EvertLaPorsha on 1/6, she will be here at 10 am for family training. Pt declines any further participation in therapy session due to nausea. Pt missed 30 min of scheduled skilled therapy services due to illness. Will continue per POC.  Therapy Documentation Precautions:  Precautions Precautions: Back Precaution Comments: TLSO worn when OOB Required Braces or Orthoses: Spinal Brace Spinal Brace: Thoracolumbosacral orthotic, Applied in sitting position Restrictions Weight Bearing Restrictions: No General: PT Amount of Missed Time (min): 30 Minutes PT Missed Treatment Reason: Patient ill (Comment)(nausea/vomiting)   Therapy/Group: Individual Therapy  Peter Congoaylor [place] Frieden, PT, DPT  04/04/2018, 12:12 PM

## 2018-04-04 NOTE — Progress Notes (Signed)
Occupational Therapy Note  Patient Details  Name: Erik [name] MRN: [id number] Date of Birth: [birth year]  Today's Date: 04/04/2018 OT Missed Time: 25 Minutes Missed Time Reason: Patient ill (comment)  1:1. Pt continues to report n/v when attempted to see pt for afternoon session. Pt declines all intervention at this time. [place] team aware of issues. Pt missed 45 min skilled OT with pt seated in bed and all needs met   [name] 04/04/2018, 2:56 PM

## 2018-04-04 NOTE — Progress Notes (Signed)
Patient was not able to keep anything down last night. Pt attempted to take night time meds but were not taken as he became nauseas and vomited them right away. IM compazine was offered as opposed to the PO compazine, pt refused. Pt would like something for nausea possibly via IV. Educated on IM compazine but pt still refused. PO compazine and tramadol were given this morning but pt vomited them back up immediately also. Pt states new left hip pain radiating to his leg.

## 2018-04-04 NOTE — Progress Notes (Signed)
Physical Therapy Note  Patient Details  Name: Erik [name] MRN: [id number] Date of Birth: [birth year] Today's Date: 04/04/2018    Attempted to see patient for PM session. Pt reports ongoing nausea/vomiting and declines participation. RN and medical team aware of ongoing symptoms. Pt left seated in bed with needs in reach. Pt missed 30 min of scheduled skilled therapy treatment time. Will continue per plan of care.   Peter Congo, PT, DPT  04/04/2018, 1:35 PM

## 2018-04-05 ENCOUNTER — Inpatient Hospital Stay (HOSPITAL_COMMUNITY): Payer: BLUE CROSS/BLUE SHIELD

## 2018-04-05 ENCOUNTER — Inpatient Hospital Stay (HOSPITAL_COMMUNITY): Payer: BLUE CROSS/BLUE SHIELD | Admitting: Occupational Therapy

## 2018-04-05 MED ORDER — SODIUM CHLORIDE 0.9 % IV SOLN
80.0000 mg | Freq: Two times a day (BID) | INTRAVENOUS | Status: DC
  Administered 2018-04-05 – 2018-04-08 (×7): 80 mg via INTRAVENOUS
  Filled 2018-04-05 (×11): qty 80

## 2018-04-05 NOTE — Plan of Care (Signed)
  Problem: Consults Goal: RH SPINAL CORD INJURY PATIENT EDUCATION Description  See Patient Education module for education specifics.  Outcome: Progressing   Problem: SCI BOWEL ELIMINATION Goal: RH STG MANAGE BOWEL WITH ASSISTANCE Description STG Manage Bowel with  Min Assistance.  Outcome: Progressing Goal: RH STG SCI MANAGE BOWEL WITH MEDICATION WITH ASSISTANCE Description STG SCI Manage bowel with medication with Min assistance.  Outcome: Progressing Goal: RH STG MANAGE BOWEL W/EQUIPMENT W/ASSISTANCE Description STG Manage Bowel With Equipment With Min Assistance  Outcome: Progressing Goal: RH STG SCI MANAGE BOWEL PROGRAM W/ASSIST OR AS APPROPRIATE Description STG SCI Manage bowel program w/min assist or as appropriate.  Outcome: Progressing   Problem: SCI BLADDER ELIMINATION Goal: RH STG MANAGE BLADDER WITH ASSISTANCE Description STG Manage Bladder With Min Assistance  Outcome: Progressing Goal: RH STG MANAGE BLADDER WITH EQUIPMENT WITH ASSISTANCE Description STG Manage Bladder With Equipment With min  Assistance  Outcome: Progressing Goal: RH STG SCI MANAGE BLADDER PROGRAM W/ASSISTANCE Description Pt will be Mod I with equipment and be able to manage bladder with I/O  cath   Outcome: Progressing   Problem: RH SKIN INTEGRITY Goal: RH STG SKIN FREE OF INFECTION/BREAKDOWN Description Pt will remain free from skin breakdown while in rehab with min assist  Outcome: Progressing Goal: RH STG MAINTAIN SKIN INTEGRITY WITH ASSISTANCE Description STG Maintain Skin Integrity With min Assistance.  Outcome: Progressing   Problem: RH SAFETY Goal: RH STG ADHERE TO SAFETY PRECAUTIONS W/ASSISTANCE/DEVICE Description STG Adhere to Safety Precautions With min Assistance/Device.  Outcome: Progressing   Problem: RH PAIN MANAGEMENT Goal: RH STG PAIN MANAGED AT OR BELOW PT'S PAIN GOAL Description Pain will be managed at a goal set of less than 3 out 10 with PRN pain med with mod  I assist  Outcome: Progressing   Problem: RH KNOWLEDGE DEFICIT SCI Goal: RH STG INCREASE KNOWLEDGE OF SELF CARE AFTER SCI Description Pt will be able to verbalize self care with Mod I assist  Outcome: Progressing   

## 2018-04-05 NOTE — Progress Notes (Signed)
Occupational Therapy Session Note  Patient Details  Name: Erik [name] MRN: [id number] Date of Birth: November 17, 1965  Today's Date: 04/05/2018 OT Individual Time: 2094-7096 OT Individual Time Calculation (min): 24 min  and Today's Date: 04/05/2018 OT Missed Time: 50 Minutes Missed Time Reason: Patient ill (comment)   Short Term Goals: Week 3:  OT Short Term Goal 1 (Week 3): Pt will complete BSC transfers with Min A and LRAD OT Short Term Goal 2 (Week 3): Pt will complete LB bathing with supervision assist using adaptive strategies and positioning techniques OT Short Term Goal 3 (Week 3): Pt will be able to direct ADL care to caregiver during 1 family education session   Skilled Therapeutic Interventions/Progress Updates:    Upon entering the room, pt supine in bed with HOB elevated. Pt with no c/o pain but continues to report nausea and vomiting the morning. Pt agreeable to grooming tasks from bed level and he performs task independently with set up A to obtain needed items. OT discussing concern as illness has kept him from being able to fully participate in OT intervention. Pt verbalizes being concerned about his strength and "I just want them to fix this" (so he can participate). Pt declined OOB activity and further OT intervention at this time. Pt remained in bed with call bell and all needed items within reach upon exiting the room.   Therapy Documentation Precautions:  Precautions Precautions: Back Precaution Comments: TLSO worn when OOB Required Braces or Orthoses: Spinal Brace Spinal Brace: Thoracolumbosacral orthotic, Applied in sitting position Restrictions Weight Bearing Restrictions: No General: General OT Amount of Missed Time: 50 Minutes Vital Signs:  Pain: Pain Assessment Pain Scale: Faces Faces Pain Scale: Hurts a little bit Pain Location: Hip Pain Orientation: Left Pain Descriptors / Indicators: Aching;Discomfort Pain Onset: On-going Pain Intervention(s):  Refused;Repositioned;Rest;Emotional support(refused po meds) ADL: ADL Upper Body Bathing: Setup Where Assessed-Upper Body Bathing: Bed level Lower Body Bathing: Minimal assistance Where Assessed-Lower Body Bathing: Bed level Upper Body Dressing: Setup Where Assessed-Upper Body Dressing: Bed level Lower Body Dressing: Minimal assistance Where Assessed-Lower Body Dressing: Bed level Toileting: Minimal assistance Where Assessed-Toileting: Bed level Toilet Transfer: Moderate assistance Toilet Transfer Method: Scientist, research (life sciences): Bedside commode, Drop arm bedside commode Tub/Shower Transfer: Not assessed   Therapy/Group: Individual Therapy  [name] 04/05/2018, 10:03 AM

## 2018-04-05 NOTE — Progress Notes (Signed)
[facility] PHYSICAL MEDICINE & REHABILITATION PROGRESS NOTE   Subjective/Complaints:   Nausea and poor appetite, feels food gets stuck and he needs to spit it back up.  States he had "esphagus problem " demonstrated by "running a light down my throat"  ROS: Patient denies fever, rash, sore throat, blurred vision,   diarrhea, cough, shortness of breath or chest pain, joint or back pain, headache, or mood change.   Objective:   Dg Abd 1 View  Result Date: 04/03/2018 CLINICAL DATA:  Nausea, vomiting, and fever. EXAM: ABDOMEN - 1 VIEW COMPARISON:  None. FINDINGS: The bowel gas pattern is normal. No radio-opaque calculi or other significant radiographic abnormality are seen.Previous thoracolumbar fusion. IMPRESSION: Benign-appearing abdomen. Electronically Signed   By: Francene Boyers M.D.   On: 04/03/2018 15:20   Recent Labs    04/03/18 1024 04/04/18 0610  WBC 3.2* 4.9  HGB 11.6* 12.0*  HCT 34.9* 37.2*  PLT 302 307   Recent Labs    04/03/18 1024 04/04/18 0610  NA 136 138  K 3.7 3.4*  CL 98 102  CO2 28 25  GLUCOSE 119* 118*  BUN 8 7  CREATININE 0.93 0.85  CALCIUM 9.2 9.1    Intake/Output Summary (Last 24 hours) at 04/05/2018 0851 Last data filed at 04/05/2018 0600 Gross per 24 hour  Intake 1458.9 ml  Output -  Net 1458.9 ml     Physical Exam: Vital Signs Blood pressure (!) 163/83, pulse 75, temperature 97.7 F (36.5 C), temperature source Oral, resp. rate 12, weight 76 kg, SpO2 100 %. Constitutional: No distress . Vital signs reviewed. Appears uncomfortable HEENT: EOMI, oral membranes moist Neck: supple Cardiovascular: RRR without murmur. No JVD    Respiratory: CTA Bilaterally without wheezes or rales. Normal effort    GI: BS +, non-tender with palpation, no rebound, non-distended  Musc: No edema or tenderness in extremities. Musculoskeletal: Lower extremity edema Neurological: He is alert and oriented  Motor: Bilateral upper extremities: 5/5 proximal to distal, no  changes Bilateral lower extremities: 0/5 proximal distal, no changes Skin: Skin is warm and dry. He is not diaphoretic.  Back incision remains CDI Psychiatric: pleasant   Assessment/Plan: 1. Functional deficits secondary to thoracic spinal cord injury which require 3+ hours per day of interdisciplinary therapy in a comprehensive inpatient rehab setting.  Physiatrist is providing close team supervision and 24 hour management of active medical problems listed below.  Physiatrist and rehab team continue to assess barriers to discharge/monitor patient progress toward functional and medical goals  Care Tool:  Bathing  Bathing activity did not occur: Refused Body parts bathed by patient: Right arm, Left arm, Chest, Abdomen, Front perineal area, Right upper leg, Left upper leg, Right lower leg, Left lower leg, Face, Buttocks   Body parts bathed by helper: Buttocks Body parts n/a: Front perineal area   Bathing assist Assist Level: Set up assist     Upper Body Dressing/Undressing Upper body dressing   What is the patient wearing?: Pull over shirt, Orthosis Orthosis activity level: Performed by patient  Upper body assist Assist Level: Set up assist    Lower Body Dressing/Undressing Lower body dressing      What is the patient wearing?: Pants, Incontinence brief, Orthosis     Lower body assist Assist for lower body dressing: Set up assist     Toileting Toileting Toileting Activity did not occur (Clothing management and hygiene only): N/A (no void or bm)  Toileting assist Assist for toileting: Minimal Assistance - Patient >  75%     Transfers Chair/bed transfer  Transfers assist     Chair/bed transfer assist level: Supervision/Verbal cueing     Locomotion Ambulation   Ambulation assist   Ambulation activity did not occur: Safety/medical concerns          Walk 10 feet activity   Assist  Walk 10 feet activity did not occur: Safety/medical concerns         Walk 50 feet activity   Assist Walk 50 feet with 2 turns activity did not occur: Safety/medical concerns         Walk 150 feet activity   Assist Walk 150 feet activity did not occur: Safety/medical concerns         Walk 10 feet on uneven surface  activity   Assist Walk 10 feet on uneven surfaces activity did not occur: Safety/medical concerns         Wheelchair     Assist Will patient use wheelchair at discharge?: Yes Type of Wheelchair: Manual    Wheelchair assist level: Independent Max wheelchair distance: 150 ft     Wheelchair 50 feet with 2 turns activity    Assist        Assist Level: Independent   Wheelchair 150 feet activity     Assist     Assist Level: Independent     Medical Problem List and Plan: 1.  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  DVT Prophylaxis/Anticoagulation: Pharmaceutical: Lovenox bid x 3 months.   -dopplers were negative  3. Pain Management: oxycodone prn.  4. Mood: continue to provide positive reinforcement  5. Neuropsych: This patient is capable of making decisions on his own behalf. 6. Skin/Wound Care: routine pressure relief measures.  7. Fluids/Electrolytes/Nutrition: poor po intake  -change to lactose free diet  -simethicone for gas 8. ABLA: Hemoglobin 11.7 on 12/23 9. Neurogenic bladder: I/O caths as needed     -recurrent E coli UTI  -continue cipro  -ditropan stopped    10 Neurogenic bowel:    -continue daily suppository for AM program  - fiber and probiotic     -resumed senna-s at HS  -had been on schedule until recent N/V began 11. Orthostatic symptoms?:   -continue  ACE/TEDs when up   abdominal binder if needed for hypotension, discussed use of abdominal binder when standing.   12.  Left knee effusion-fibular fx, noted on Xray  -folllow up xrays  demonstrate early callus formation  -no bracing needed. Transfer/movement as tolerated 13. Nausea:suspect esophagitis +/- stricture, will get Ba++ esophargram, increase IV protonix to 80mg  BID  -persistent n/v -  -kub reviewed and unremarkable, exam not overly impressive  -labs all reviewed this morning, potassium a little low  - amylase,lipase, LFT's all normal.  -continue NS IVF with Kcl  -push fluids and general po intake, treat supportively  -continue IV cipro (hadn't used ceftriaxone because there was concern over intolerance of keflex initially). Can switch to PO abx once he's able to hold food down.   LOS: 26 days A FACE TO FACE EVALUATION WAS PERFORMED  Erick Colace 04/05/2018, 8:51 AM

## 2018-04-06 ENCOUNTER — Inpatient Hospital Stay (HOSPITAL_COMMUNITY): Payer: BLUE CROSS/BLUE SHIELD | Admitting: Occupational Therapy

## 2018-04-06 ENCOUNTER — Inpatient Hospital Stay (HOSPITAL_COMMUNITY): Payer: BLUE CROSS/BLUE SHIELD | Admitting: Physical Therapy

## 2018-04-06 MED ORDER — KETOROLAC TROMETHAMINE 30 MG/ML IJ SOLN
30.0000 mg | Freq: Four times a day (QID) | INTRAMUSCULAR | Status: DC | PRN
  Administered 2018-04-06 – 2018-04-07 (×4): 30 mg via INTRAMUSCULAR
  Filled 2018-04-06 (×5): qty 1

## 2018-04-06 MED ORDER — METOCLOPRAMIDE HCL 5 MG/ML IJ SOLN
5.0000 mg | Freq: Four times a day (QID) | INTRAMUSCULAR | Status: DC
  Administered 2018-04-06 – 2018-04-09 (×11): 5 mg via INTRAVENOUS
  Filled 2018-04-06 (×11): qty 2

## 2018-04-06 MED ORDER — SODIUM CHLORIDE 0.9 % IV SOLN
1.0000 g | INTRAVENOUS | Status: DC
  Administered 2018-04-06 – 2018-04-08 (×3): 1 g via INTRAVENOUS
  Filled 2018-04-06 (×3): qty 10

## 2018-04-06 NOTE — Progress Notes (Signed)
Patient refuses all PO meds due to nausea and vomiting. Patient educated.

## 2018-04-06 NOTE — Progress Notes (Signed)
[facility] PHYSICAL MEDICINE & REHABILITATION PROGRESS NOTE   Subjective/Complaints:   Nausea and poor appetite, feels food gets stuck and he needs to spit it back up.  States he had "esphagus problem " demonstrated by "running a light down my throat"  Discussed result of Ba++ esophogram  ROS: Patient denies fever, rash, sore throat, blurred vision,   diarrhea, cough, shortness of breath or chest pain, joint or back pain, headache, or mood change.   Objective:   Dg Esophagus Inc Scout Chest & Delayed Img Single Cm (ba Or Sol)  Result Date: 04/05/2018 CLINICAL DATA:  Esophageal dysplasia. EXAM: ESOPHOGRAM / BARIUM SWALLOW / BARIUM TABLET STUDY TECHNIQUE: Combined double contrast and single contrast examination performed using effervescent crystals, thick barium liquid, and thin barium liquid. The patient was observed with fluoroscopy swallowing a 13 mm barium sulphate tablet. FLUOROSCOPY TIME:  Fluoroscopy Time:  2 minutes Radiation Exposure Index (if provided by the fluoroscopic device): Number of Acquired Spot Images: 1 COMPARISON:  None. FINDINGS: Because of mobility limitations of the patient the exam was performed in the semi upright position as the patient could not stand. Initially the patient ingested thin barium. This easily passed through the esophagus and into the stomach. The esophagus has a normal caliber. No mass or stricture identified. Next, the patient ingested a 13 mm barium tablet. The tablet easily passed through the proximal and mid esophagus. There was mild stasis of tablet transit within the distal aspect of the esophagus likely reflecting recumbent orientation. The patient was moved more upright position and with several swallows of water and thin barium the tablet passed into the stomach. Lastly, the patient ingested several aunts is of thick barium. Stasis of the thick barium was noted within the mid and distal esophagus which, likely reflects recumbent orientation.  IMPRESSION: 1. No stricture or mass identified within the esophagus. Electronically Signed   By: Signa Kell M.D.   On: 04/05/2018 10:54   Recent Labs    04/03/18 1024 04/04/18 0610  WBC 3.2* 4.9  HGB 11.6* 12.0*  HCT 34.9* 37.2*  PLT 302 307   Recent Labs    04/03/18 1024 04/04/18 0610  NA 136 138  K 3.7 3.4*  CL 98 102  CO2 28 25  GLUCOSE 119* 118*  BUN 8 7  CREATININE 0.93 0.85  CALCIUM 9.2 9.1    Intake/Output Summary (Last 24 hours) at 04/06/2018 0853 Last data filed at 04/06/2018 0600 Gross per 24 hour  Intake 2102.08 ml  Output 1926 ml  Net 176.08 ml     Physical Exam: Vital Signs Blood pressure (!) 146/105, pulse 86, temperature 98.4 F (36.9 C), temperature source Oral, resp. rate 16, weight 76 kg, SpO2 100 %. Constitutional: No distress . Vital signs reviewed. Appears uncomfortable HEENT: EOMI, oral membranes moist Neck: supple Cardiovascular: RRR without murmur. No JVD    Respiratory: CTA Bilaterally without wheezes or rales. Normal effort    GI: BS +, non-tender with palpation, no rebound, non-distended  Musc: No edema or tenderness in extremities. Musculoskeletal: Lower extremity edema Neurological: He is alert and oriented  Motor: Bilateral upper extremities: 5/5 proximal to distal, no changes Bilateral lower extremities: 0/5 proximal distal, no changes Skin: Skin is warm and dry. He is not diaphoretic.  Back incision remains CDI Psychiatric: pleasant   Assessment/Plan: 1. Functional deficits secondary to thoracic spinal cord injury which require 3+ hours per day of interdisciplinary therapy in a comprehensive inpatient rehab setting.  Physiatrist is providing close  team supervision and 24 hour management of active medical problems listed below.  Physiatrist and rehab team continue to assess barriers to discharge/monitor patient progress toward functional and medical goals  Care Tool:  Bathing  Bathing activity did not occur: Refused Body  parts bathed by patient: Right arm, Left arm, Chest, Abdomen, Front perineal area, Right upper leg, Left upper leg, Right lower leg, Left lower leg, Face, Buttocks   Body parts bathed by helper: Buttocks Body parts n/a: Front perineal area   Bathing assist Assist Level: Set up assist     Upper Body Dressing/Undressing Upper body dressing   What is the patient wearing?: Pull over shirt, Orthosis Orthosis activity level: Performed by patient  Upper body assist Assist Level: Set up assist    Lower Body Dressing/Undressing Lower body dressing      What is the patient wearing?: Pants, Incontinence brief, Orthosis     Lower body assist Assist for lower body dressing: Set up assist     Toileting Toileting Toileting Activity did not occur (Clothing management and hygiene only): N/A (no void or bm)  Toileting assist Assist for toileting: Minimal Assistance - Patient > 75%     Transfers Chair/bed transfer  Transfers assist     Chair/bed transfer assist level: Minimal Assistance - Patient > 75%     Locomotion Ambulation   Ambulation assist   Ambulation activity did not occur: Safety/medical concerns          Walk 10 feet activity   Assist  Walk 10 feet activity did not occur: Safety/medical concerns        Walk 50 feet activity   Assist Walk 50 feet with 2 turns activity did not occur: Safety/medical concerns         Walk 150 feet activity   Assist Walk 150 feet activity did not occur: Safety/medical concerns         Walk 10 feet on uneven surface  activity   Assist Walk 10 feet on uneven surfaces activity did not occur: Safety/medical concerns         Wheelchair     Assist Will patient use wheelchair at discharge?: Yes Type of Wheelchair: Manual    Wheelchair assist level: Independent Max wheelchair distance: 150 ft     Wheelchair 50 feet with 2 turns activity    Assist        Assist Level: Independent   Wheelchair  150 feet activity     Assist     Assist Level: Independent     Medical Problem List and Plan: 1.  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  DVT Prophylaxis/Anticoagulation: Pharmaceutical: Lovenox bid x 3 months.   -dopplers were negative  3. Pain Management: oxycodone prn.  4. Mood: continue to provide positive reinforcement  5. Neuropsych: This patient is capable of making decisions on his own behalf. 6. Skin/Wound Care: routine pressure relief measures.  7. Fluids/Electrolytes/Nutrition: poor po intake  -change to lactose free diet  -simethicone for gas 8. ABLA: Hemoglobin 11.7 on 12/23 9. Neurogenic bladder: I/O caths as needed     -recurrent E coli UTI  -continue cipro  -ditropan stopped    10 Neurogenic bowel:    -continue daily suppository for AM program  - fiber and probiotic     -resumed senna-s at HS  -had been on schedule until recent N/V began 11. Orthostatic  symptoms?:   -continue  ACE/TEDs when up   abdominal binder if needed for hypotension, discussed use of abdominal binder when standing.   12.  Left knee effusion-fibular fx, noted on Xray  -folllow up xrays demonstrate early callus formation  -no bracing needed. Transfer/movement as tolerated 13. Nausea:suspect esophagitis no stricture Ba++ esophargram, may have positional stasis vs dysmotility, will order up in chair for meal, increase IV protonix to 80mg  BID  -persistent n/v -mainly meal related     - amylase,lipase, LFT's all normal.  -continue NS IVF with Kcl  -push fluids and general po intake, treat supportively  -continue IV cipro (hadn't used ceftriaxone because there was concern over intolerance of keflex initially). Can switch to PO abx once he's able to hold food down.   LOS: 27 days A FACE TO FACE EVALUATION WAS PERFORMED  Erick Colace 04/06/2018,  8:53 AM

## 2018-04-06 NOTE — Progress Notes (Signed)
Family stated concerns regarding pt current condition and asked what the next step would be. Pt has been having nausea and vomiting for few days. Pt is complaining of epigastric pain today starting this afternoon. Pain medicine and maalox given, but not effective. Dr. Wynn BankerKirsteins made aware. Continue plan of care.   Briseida Gittings W June Vacha

## 2018-04-06 NOTE — Progress Notes (Addendum)
Occupational Therapy Session Note  Patient Details  Name: Erik [name] MRN: [id number] Date of Birth: [birth year]  Today's Date: 04/06/2018 OT Individual Time: 4383-8184 OT Individual Time Calculation (min): 10 min  50 minutes missed tx time due to illness  Short Term Goals: Week 3:  OT Short Term Goal 1 (Week 3): Pt will complete BSC transfers with Min A and LRAD OT Short Term Goal 2 (Week 3): Pt will complete LB bathing with supervision assist using adaptive strategies and positioning techniques OT Short Term Goal 3 (Week 3): Pt will be able to direct ADL care to caregiver during 1 family education session   Skilled Therapeutic Interventions/Progress Updates:    Pt greeted in bed with RN present. Per pt, he had just thrown up and felt awful. He expressed frustration regarding persistent illness and OT offered emotional support. With pt consent, provided him with 2 anti-nausea aromatherapy blends to use via inhalation. Pt left with all needs in bed. 50 minutes missed due to illness.   Therapy Documentation Precautions:  Precautions Precautions: Back Precaution Comments: TLSO worn when OOB Required Braces or Orthoses: Spinal Brace Spinal Brace: Thoracolumbosacral orthotic, Applied in sitting position Restrictions Weight Bearing Restrictions: No Vital Signs: Therapy Vitals Temp: 98.1 F (36.7 C) Temp Source: Oral Pulse Rate: 87 Resp: 20 BP: (!) 158/97 Patient Position (if appropriate): Lying Oxygen Therapy SpO2: 100 % O2 Device: Room Air Pain: Pain in abdomen. Assisted with repositioning in bed to minimize pain. Pain Assessment Pain Scale: 0-10 Pain Score: 5  Pain Type: Acute pain Pain Location: Abdomen(from nausea and vomiting, per pt) Pain Orientation: Upper Pain Descriptors / Indicators: Discomfort;Aching Pain Frequency: Constant Pain Onset: On-going Pain Intervention(s): Medication (See eMAR)(tramadol given) ADL: ADL Upper Body Bathing: Setup Where Assessed-Upper  Body Bathing: Bed level Lower Body Bathing: Minimal assistance Where Assessed-Lower Body Bathing: Bed level Upper Body Dressing: Setup Where Assessed-Upper Body Dressing: Bed level Lower Body Dressing: Minimal assistance Where Assessed-Lower Body Dressing: Bed level Toileting: Minimal assistance Where Assessed-Toileting: Bed level Toilet Transfer: Moderate assistance Toilet Transfer Method: Scientist, research (life sciences): Bedside commode, Drop arm bedside commode Tub/Shower Transfer: Not assessed      Therapy/Group: Individual Therapy  Cheree Fowles A Rolonda Pontarelli 04/06/2018, 4:16 PM

## 2018-04-06 NOTE — Plan of Care (Signed)
  Problem: Consults Goal: RH SPINAL CORD INJURY PATIENT EDUCATION Description  See Patient Education module for education specifics.  Outcome: Progressing   Problem: SCI BOWEL ELIMINATION Goal: RH STG MANAGE BOWEL WITH ASSISTANCE Description STG Manage Bowel with  Min Assistance.  Outcome: Progressing Goal: RH STG SCI MANAGE BOWEL WITH MEDICATION WITH ASSISTANCE Description STG SCI Manage bowel with medication with Min assistance.  Outcome: Progressing Goal: RH STG MANAGE BOWEL W/EQUIPMENT W/ASSISTANCE Description STG Manage Bowel With Equipment With Min Assistance  Outcome: Progressing Goal: RH STG SCI MANAGE BOWEL PROGRAM W/ASSIST OR AS APPROPRIATE Description STG SCI Manage bowel program w/min assist or as appropriate.  Outcome: Progressing   Problem: SCI BLADDER ELIMINATION Goal: RH STG MANAGE BLADDER WITH ASSISTANCE Description STG Manage Bladder With Min Assistance  Outcome: Progressing Goal: RH STG MANAGE BLADDER WITH EQUIPMENT WITH ASSISTANCE Description STG Manage Bladder With Equipment With min  Assistance  Outcome: Progressing Goal: RH STG SCI MANAGE BLADDER PROGRAM W/ASSISTANCE Description Pt will be Mod I with equipment and be able to manage bladder with I/O  cath   Outcome: Progressing   Problem: RH SKIN INTEGRITY Goal: RH STG SKIN FREE OF INFECTION/BREAKDOWN Description Pt will remain free from skin breakdown while in rehab with min assist  Outcome: Progressing Goal: RH STG MAINTAIN SKIN INTEGRITY WITH ASSISTANCE Description STG Maintain Skin Integrity With min Assistance.  Outcome: Progressing   Problem: RH SAFETY Goal: RH STG ADHERE TO SAFETY PRECAUTIONS W/ASSISTANCE/DEVICE Description STG Adhere to Safety Precautions With min Assistance/Device.  Outcome: Progressing   Problem: RH PAIN MANAGEMENT Goal: RH STG PAIN MANAGED AT OR BELOW PT'S PAIN GOAL Description Pain will be managed at a goal set of less than 3 out 10 with PRN pain med with mod  I assist  Outcome: Progressing   Problem: RH KNOWLEDGE DEFICIT SCI Goal: RH STG INCREASE KNOWLEDGE OF SELF CARE AFTER SCI Description Pt will be able to verbalize self care with Mod I assist  Outcome: Progressing

## 2018-04-06 NOTE — Progress Notes (Signed)
Occupational Therapy Session Note  Patient Details  Name: Erik [name] MRN: [id number] Date of Birth: 01-11-1966  Today's Date: 04/06/2018 OT Group Time: 1100-1200 OT Group Time Calculation (min): 60 min  Skilled Therapeutic Interventions/Progress Updates:    Pt engaged in therapeutic w/c level dance group focusing on patient choice, UE/LE strengthening, salience, activity tolerance, and social participation. Pt was guided through various dance-based exercises involving UEs/LEs and trunk. All music was selected by group members. Emphasis placed on UE strengthening and endurance. Pt active and participatory throughout session. Initiated rest breaks often due to convalescence. He used his leg loops when group completed LE exercises. At end of session RT returned pt to room.     Therapy Documentation Precautions:  Precautions Precautions: Back Precaution Comments: TLSO worn when OOB Required Braces or Orthoses: Spinal Brace Spinal Brace: Thoracolumbosacral orthotic, Applied in sitting position Restrictions Weight Bearing Restrictions: No General: General OT Amount of Missed Time: 50 Minutes Pain: No s/s pain during session  Pain Assessment Pain Scale: 0-10 Pain Score: 5  Pain Type: Acute pain Pain Location: Abdomen(from nausea and vomiting, per pt) Pain Orientation: Upper Pain Descriptors / Indicators: Discomfort;Aching Pain Frequency: Constant Pain Onset: On-going Pain Intervention(s): Medication (See eMAR)(tramadol given) ADL: ADL Upper Body Bathing: Setup Where Assessed-Upper Body Bathing: Bed level Lower Body Bathing: Minimal assistance Where Assessed-Lower Body Bathing: Bed level Upper Body Dressing: Setup Where Assessed-Upper Body Dressing: Bed level Lower Body Dressing: Minimal assistance Where Assessed-Lower Body Dressing: Bed level Toileting: Minimal assistance Where Assessed-Toileting: Bed level Toilet Transfer: Moderate assistance Toilet Transfer Method: DietitianTransfer  board Toilet Transfer Equipment: Bedside commode, Drop arm bedside commode Tub/Shower Transfer: Not assessed      Therapy/Group: Group Therapy  [name] 04/06/2018, 4:08 PM

## 2018-04-06 NOTE — Progress Notes (Signed)
Physical Therapy Session Note  Patient Details  Name: Erik [name] MRN: [id number] Date of Birth: January 30, 1966  Today's Date: 04/06/2018     Short Term Goals: Week 4:  PT Short Term Goal 1 (Week 4): =LTG due to ELOS  Skilled Therapeutic Interventions/Progress Updates: Pt presented in bed continues to have significant nausea/vomiting. Pt stating still does not feel able to participate in therapy. Will continue efforts.      Therapy Documentation Precautions:  Precautions Precautions: Back Precaution Comments: TLSO worn when OOB Required Braces or Orthoses: Spinal Brace Spinal Brace: Thoracolumbosacral orthotic, Applied in sitting position Restrictions Weight Bearing Restrictions: No General:   Vital Signs: Therapy Vitals Temp: 98.4 F (36.9 C) Temp Source: Oral Pulse Rate: 86 Resp: 16 BP: (!) 146/105 Patient Position (if appropriate): Lying Oxygen Therapy SpO2: 100 %   Therapy/Group: Individual Therapy  Erik [name] 04/06/2018, 8:01 AM

## 2018-04-07 ENCOUNTER — Encounter (HOSPITAL_COMMUNITY): Payer: BLUE CROSS/BLUE SHIELD | Admitting: Occupational Therapy

## 2018-04-07 ENCOUNTER — Ambulatory Visit (HOSPITAL_COMMUNITY): Payer: BLUE CROSS/BLUE SHIELD | Admitting: Physical Therapy

## 2018-04-07 ENCOUNTER — Inpatient Hospital Stay (HOSPITAL_COMMUNITY): Payer: BLUE CROSS/BLUE SHIELD | Admitting: Occupational Therapy

## 2018-04-07 ENCOUNTER — Inpatient Hospital Stay (HOSPITAL_COMMUNITY): Payer: BLUE CROSS/BLUE SHIELD

## 2018-04-07 LAB — BASIC METABOLIC PANEL
Anion gap: 7 (ref 5–15)
BUN: 9 mg/dL (ref 6–20)
CHLORIDE: 104 mmol/L (ref 98–111)
CO2: 22 mmol/L (ref 22–32)
Calcium: 9.2 mg/dL (ref 8.9–10.3)
Creatinine, Ser: 0.8 mg/dL (ref 0.61–1.24)
GFR calc Af Amer: >60 mL/min (ref >60)
Glucose, Bld: 101 mg/dL — ABNORMAL HIGH (ref 70–99)
Potassium: 4 mmol/L (ref 3.5–5.1)
Sodium: 133 mmol/L — ABNORMAL LOW (ref 135–145)

## 2018-04-07 LAB — CBC
HCT: 39.7 % (ref 39.0–52.0)
HEMOGLOBIN: 13.4 g/dL (ref 13.0–17.0)
MCH: 28.5 pg (ref 26.0–34.0)
MCHC: 33.8 g/dL (ref 30.0–36.0)
MCV: 84.3 fL (ref 80.0–100.0)
Platelets: 334 10*3/uL (ref 150–400)
RBC: 4.71 MIL/uL (ref 4.22–5.81)
RDW: 12.4 % (ref 11.5–15.5)
WBC: 4.3 10*3/uL (ref 4.0–10.5)
nRBC: 0 % (ref 0.0–0.2)

## 2018-04-07 MED ORDER — PANTOPRAZOLE SODIUM 40 MG PO TBEC
40.0000 mg | DELAYED_RELEASE_TABLET | Freq: Every day | ORAL | Status: DC
  Administered 2018-04-09: 40 mg via ORAL
  Filled 2018-04-07: qty 1

## 2018-04-07 MED ORDER — PANTOPRAZOLE SODIUM 40 MG PO TBEC: 40.0000 mg | DELAYED_RELEASE_TABLET | Freq: Every day | ORAL | Status: DC

## 2018-04-07 MED ORDER — KCL IN DEXTROSE-NACL 20-5-0.9 MEQ/L-%-% IV SOLN
INTRAVENOUS | Status: DC
  Administered 2018-04-07 – 2018-04-09 (×4): via INTRAVENOUS
  Filled 2018-04-07 (×4): qty 1000

## 2018-04-07 NOTE — Progress Notes (Signed)
Occupational Therapy Note  Patient Details  Name: Erik [name] MRN: [id number] Date of Birth: 1965/06/17  Today's Date: 04/07/2018 OT Missed Time: 75 Minutes Missed Time Reason: Patient ill (comment)  Upon entering the room, pt sleeping soundly and caregiver present. Caregiver reports pt has not slept well and requests him to continue to rest at this time. Caregiver reports pt continues to vomit and feels nauseated. Per chart he refused PM medications secondary to vomiting.    [name] 04/07/2018, 7:34 AM

## 2018-04-07 NOTE — Progress Notes (Signed)
[place] PHYSICAL MEDICINE & REHABILITATION PROGRESS NOTE   Subjective/Complaints:   Still complains of nausea and lower abdominal discomfort. Tries to eat something and then "it comes back up after a few minutes.". denies any odynophagia. Denies food getting "caught" in throat. Really hasn't been able to take any of PO meds  ROS: Patient denies fever, rash, sore throat, blurred vision,  , cough, shortness of breath or chest pain, joint or back pain, headache, or mood change.    Objective:   Dg Esophagus Inc Scout Chest & Delayed Img Single Cm (ba Or Sol)  Result Date: 04/05/2018 CLINICAL DATA:  Esophageal dysplasia. EXAM: ESOPHOGRAM / BARIUM SWALLOW / BARIUM TABLET STUDY TECHNIQUE: Combined double contrast and single contrast examination performed using effervescent crystals, thick barium liquid, and thin barium liquid. The patient was observed with fluoroscopy swallowing a 13 mm barium sulphate tablet. FLUOROSCOPY TIME:  Fluoroscopy Time:  2 minutes Radiation Exposure Index (if provided by the fluoroscopic device): Number of Acquired Spot Images: 1 COMPARISON:  None. FINDINGS: Because of mobility limitations of the patient the exam was performed in the semi upright position as the patient could not stand. Initially the patient ingested thin barium. This easily passed through the esophagus and into the stomach. The esophagus has a normal caliber. No mass or stricture identified. Next, the patient ingested a 13 mm barium tablet. The tablet easily passed through the proximal and mid esophagus. There was mild stasis of tablet transit within the distal aspect of the esophagus likely reflecting recumbent orientation. The patient was moved more upright position and with several swallows of water and thin barium the tablet passed into the stomach. Lastly, the patient ingested several aunts is of thick barium. Stasis of the thick barium was noted within the mid and distal esophagus which, likely reflects  recumbent orientation. IMPRESSION: 1. No stricture or mass identified within the esophagus. Electronically Signed   By: Signa Kellaylor  Stroud M.D.   On: 04/05/2018 10:54   Recent Labs    04/07/18 0643  WBC 4.3  HGB 13.4  HCT 39.7  PLT 334   No results for input(s): NA, K, CL, CO2, GLUCOSE, BUN, CREATININE, CALCIUM in the last 72 hours.  Intake/Output Summary (Last 24 hours) at 04/07/2018 0848 Last data filed at 04/07/2018 0515 Gross per 24 hour  Intake 1670.41 ml  Output 675 ml  Net 995.41 ml     Physical Exam: Vital Signs Blood pressure (!) 147/96, pulse 94, temperature 99.3 F (37.4 C), temperature source Oral, resp. rate 18, weight 69.8 kg, SpO2 99 %. Constitutional: No distress . Vital signs reviewed. HEENT: EOMI, oral membranes moist Neck: supple Cardiovascular: RRR without murmur. No JVD    Respiratory: CTA Bilaterally without wheezes or rales. Normal effort    GI: BS +, no tenderness with palpation, no rebound, non-distended  Musc: No edema or tenderness in extremities. Musculoskeletal: Lower extremity edema Neurological: He is alert and oriented  Motor: Bilateral upper extremities: 5/5 proximal to distal, no changes Bilateral lower extremities: 0/5 proximal distal, stable Skin: Skin is warm and dry. He is not diaphoretic.  Back incision remains CDI Psychiatric: flat   Assessment/Plan: 1. Functional deficits secondary to thoracic spinal cord injury which require 3+ hours per day of interdisciplinary therapy in a comprehensive inpatient rehab setting.  Physiatrist is providing close team supervision and 24 hour management of active medical problems listed below.  Physiatrist and rehab team continue to assess barriers to discharge/monitor patient progress toward functional and medical goals  Care Tool:  Bathing  Bathing activity did not occur: Refused Body parts bathed by patient: Right arm, Left arm, Chest, Abdomen, Front perineal area, Right upper leg, Left upper leg,  Right lower leg, Left lower leg, Face, Buttocks   Body parts bathed by helper: Buttocks Body parts n/a: Front perineal area   Bathing assist Assist Level: Set up assist     Upper Body Dressing/Undressing Upper body dressing   What is the patient wearing?: Pull over shirt, Orthosis Orthosis activity level: Performed by patient  Upper body assist Assist Level: Set up assist    Lower Body Dressing/Undressing Lower body dressing      What is the patient wearing?: Pants, Incontinence brief, Orthosis     Lower body assist Assist for lower body dressing: Set up assist     Toileting Toileting Toileting Activity did not occur (Clothing management and hygiene only): N/A (no void or bm)  Toileting assist Assist for toileting: Moderate Assistance - Patient 50 - 74%     Transfers Chair/bed transfer  Transfers assist     Chair/bed transfer assist level: Moderate Assistance - Patient 50 - 74%     Locomotion Ambulation   Ambulation assist   Ambulation activity did not occur: Safety/medical concerns          Walk 10 feet activity   Assist  Walk 10 feet activity did not occur: Safety/medical concerns        Walk 50 feet activity   Assist Walk 50 feet with 2 turns activity did not occur: Safety/medical concerns         Walk 150 feet activity   Assist Walk 150 feet activity did not occur: Safety/medical concerns         Walk 10 feet on uneven surface  activity   Assist Walk 10 feet on uneven surfaces activity did not occur: Safety/medical concerns         Wheelchair     Assist Will patient use wheelchair at discharge?: Yes Type of Wheelchair: Manual    Wheelchair assist level: Independent Max wheelchair distance: 150 ft     Wheelchair 50 feet with 2 turns activity    Assist        Assist Level: Independent   Wheelchair 150 feet activity     Assist     Assist Level: Independent     Medical Problem List and Plan: 1.   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  DVT Prophylaxis/Anticoagulation: Pharmaceutical: Lovenox bid x 3 months.   -dopplers were negative  3. Pain Management: oxycodone prn.  4. Mood: continue to provide positive reinforcement  5. Neuropsych: This patient is capable of making decisions on his own behalf. 6. Skin/Wound Care: routine pressure relief measures.  7. Fluids/Electrolytes/Nutrition: poor po intake  -change to lactose free diet  -simethicone for gas 8. ABLA: Hemoglobin 11.7 on 12/23 9. Neurogenic bladder: I/O caths as needed     -recurrent E coli UTI  -continue ceftriaxone as below  -ditropan stopped    10 Neurogenic bowel:    -continue daily suppository for AM program  - fiber and probiotic     -resumed senna-s at HS  -had been on schedule until recent N/V began 11. Orthostatic symptoms?:   -continue  ACE/TEDs when up   abdominal binder if needed for hypotension, discussed use of abdominal binder when standing.   12.  Left knee effusion-fibular fx, noted on Xray  -folllow up xrays demonstrate early callus formation  -no bracing needed. Transfer/movement as tolerated 13. Nausea:remains a perplexing situation given that he was doing quite well until this began mid-last week.   -labs, imaging essentially normal so far  -meds have been changed, adjusted which may contribute to nausea. Dc gabapentin and melatonin also (but hasn't taken for a few days)  -treating UTI  -?history of esophageal dysmotility---  -abdominal discomfort remains in hypogastric area  -can't keep meds or any PO consistently down   -continue IVF, change do D5NS with Kcl  -push fluids and general po intake, treat supportively  -check CT abdomen today. Request GI consult    LOS: 28 days A FACE TO FACE EVALUATION WAS PERFORMED  Ranelle OysterZachary T Lincoln Kleiner 04/07/2018, 8:48 AM

## 2018-04-07 NOTE — Plan of Care (Signed)
  Problem: Consults Goal: RH SPINAL CORD INJURY PATIENT EDUCATION Description  See Patient Education module for education specifics.  Outcome: Progressing   Problem: SCI BLADDER ELIMINATION Goal: RH STG MANAGE BLADDER WITH ASSISTANCE Description STG Manage Bladder With Min Assistance  Outcome: Progressing Goal: RH STG MANAGE BLADDER WITH EQUIPMENT WITH ASSISTANCE Description STG Manage Bladder With Equipment With min  Assistance  Outcome: Progressing Goal: RH STG SCI MANAGE BLADDER PROGRAM W/ASSISTANCE Description Pt will be Mod I with equipment and be able to manage bladder with I/O  cath   Outcome: Progressing   Problem: RH SAFETY Goal: RH STG ADHERE TO SAFETY PRECAUTIONS W/ASSISTANCE/DEVICE Description STG Adhere to Safety Precautions With min Assistance/Device.  Outcome: Progressing   Problem: RH PAIN MANAGEMENT Goal: RH STG PAIN MANAGED AT OR BELOW PT'S PAIN GOAL Description Pain will be managed at a goal set of less than 3 out 10 with PRN pain med with mod I assist  Outcome: Progressing   Problem: RH KNOWLEDGE DEFICIT SCI Goal: RH STG INCREASE KNOWLEDGE OF SELF CARE AFTER SCI Description Pt will be able to verbalize self care with Mod I assist  Outcome: Progressing    Pt not progressing with bowel program as he has retained Ba from Ba swallow test, pt is nauseaus and unable to eat right now.  Pt has wound to buttock from [facility], states he was "stuck" to it and his skin came off.  Small areas on buttock, 1 cm x2

## 2018-04-07 NOTE — Progress Notes (Signed)
Occupational Therapy Session Note  Patient Details  Name: Erik [name] MRN: [id number] Date of Birth: [birth year]  Today's Date: 04/07/2018 OT Individual Time:  - 60 minutes missed     Short Term Goals: Week 3:  OT Short Term Goal 1 (Week 3): Pt will complete BSC transfers with Min A and LRAD OT Short Term Goal 2 (Week 3): Pt will complete LB bathing with supervision assist using adaptive strategies and positioning techniques OT Short Term Goal 3 (Week 3): Pt will be able to direct ADL care to caregiver during 1 family education session     Skilled Therapeutic Interventions/Progress Updates:    Pt seen in bed with RN present. He reports still feeling too ill to get OOB. Girlfriend Erik [name] present for family education, however both requesting to defer education until pt is well enough for hands on Higher education careers adviser. 60 minutes missed.   Therapy Documentation Precautions:  Precautions Precautions: Back Precaution Comments: TLSO worn when OOB Required Braces or Orthoses: Spinal Brace Spinal Brace: Thoracolumbosacral orthotic, Applied in sitting position Restrictions Weight Bearing Restrictions: No Vital Signs: Therapy Vitals Temp: 99.3 F (37.4 C) Temp Source: Oral Pulse Rate: 94 Resp: 18 BP: (!) 147/96 Patient Position (if appropriate): Lying Oxygen Therapy SpO2: 99 % O2 Device: Room Air Pain: In abdomen due to illness. Pt declining for OT to ask RN for medicinal interventions   ADL: ADL Upper Body Bathing: Setup Where Assessed-Upper Body Bathing: Bed level Lower Body Bathing: Minimal assistance Where Assessed-Lower Body Bathing: Bed level Upper Body Dressing: Setup Where Assessed-Upper Body Dressing: Bed level Lower Body Dressing: Minimal assistance Where Assessed-Lower Body Dressing: Bed level Toileting: Minimal assistance Where Assessed-Toileting: Bed level Toilet Transfer: Moderate assistance Toilet Transfer Method: Scientist, research (life sciences):  Bedside commode, Drop arm bedside commode Tub/Shower Transfer: Not assessed      Therapy/Group: Individual Therapy  [name] A Debbie Bellucci 04/07/2018, 7:10 AM

## 2018-04-07 NOTE — Progress Notes (Signed)
Physical Therapy Session Note  Patient Details  Name: Erik [name] MRN: [id number] Date of Birth: [birth year]  Today's Date: 04/07/2018 PT Individual Time: 1100-1115 PT Individual Time Calculation (min): 15 min   Short Term Goals: Week 4:  PT Short Term Goal 1 (Week 4): =LTG due to ELOS  Skilled Therapeutic Interventions/Progress Updates:    Pt received seated in bed. Pt reports ongoing nausea/vomiting and that he is getting ready to go for a CT scan for more information as to what is causing his ongoing illness. Reviewed education on home stretching program and pressure relief. Pt demos good understanding and insight into education. Pt left seated in bed with needs in reach. Pt missed 45 min of scheduled skilled therapy session due to ongoing illness.  Therapy Documentation Precautions:  Precautions Precautions: Back Precaution Comments: TLSO worn when OOB Required Braces or Orthoses: Spinal Brace Spinal Brace: Thoracolumbosacral orthotic, Applied in sitting position Restrictions Weight Bearing Restrictions: No General: PT Amount of Missed Time (min): 45 Minutes PT Missed Treatment Reason: Patient ill (Comment)(nausea/vomiting)   Therapy/Group: Individual Therapy  Peter Congo, PT, DPT  04/07/2018, 12:13 PM

## 2018-04-07 NOTE — Progress Notes (Signed)
Occupational Therapy Note  Patient Details  Name: Erik [name] MRN: [id number] Date of Birth: 11/21/1965  Today's Date: 04/07/2018 OT Missed Time: 75 Minutes Missed Time Reason: Patient ill (comment);Nursing care(Nausea)  Pt declining therapy session 2/2 nausea and generalized weakness from not being able to eat/keep food in.  RN entering room preparing for nursing care, administering enema. Pt left in supine to rest, will cont POC as pt able to participate.    Rexine Gowens L 04/07/2018, 2:18 PM

## 2018-04-07 NOTE — Progress Notes (Signed)
Occupational Therapy Weekly Progress Note  Patient Details  Name: Erik [name] MRN: [id number] Date of Birth: Apr 15, 1965  Beginning of progress report period: March 28, 2018 End of progress report period: April 07, 2018     Patient has met 0 of 8 long term goals.  Short term goals not set due to estimated length of stay.  Pt has not been able to participate in ADLs or functional transfers over the last 5-7 days secondary to functional declined. Pt has been missing OT intervention secondary to illness. Test and procedures being performed at this time to address concerns. Family education has been initiated in preparation for discharge home. Pt's discharge date has been extended to allow therapist to address functional deficits.  Patient continues to demonstrate the following deficits: muscle weakness and muscle paralysis, abnormal tone and decreased sitting balance and decreased balance strategies and therefore will continue to benefit from skilled OT intervention to enhance overall performance with BADL, iADL and Reduce care partner burden.  See Patient's Care Plan for progression toward long term goals.  Patient progressing toward long term goals..  Continue plan of care.       Therapy Documentation Precautions:  Precautions Precautions: Back Precaution Comments: TLSO worn when OOB Required Braces or Orthoses: Spinal Brace Spinal Brace: Thoracolumbosacral orthotic, Applied in sitting position Restrictions Weight Bearing Restrictions: No General: General OT Amount of Missed Time: 75 Minutes Vital Signs: Therapy Vitals Temp: 98.8 F (37.1 C) Temp Source: Oral Pulse Rate: 89 Resp: 18 BP: (!) 161/91 Patient Position (if appropriate): Lying Oxygen Therapy SpO2: 100 % O2 Device: Room Air Pain:   ADL: ADL Upper Body Bathing: Setup Where Assessed-Upper Body Bathing: Bed level Lower Body Bathing: Minimal assistance Where Assessed-Lower Body Bathing: Bed level Upper Body  Dressing: Setup Where Assessed-Upper Body Dressing: Bed level Lower Body Dressing: Minimal assistance Where Assessed-Lower Body Dressing: Bed level Toileting: Minimal assistance Where Assessed-Toileting: Bed level Toilet Transfer: Moderate assistance Toilet Transfer Method: Theatre manager: Bedside commode, Drop arm bedside commode Tub/Shower Transfer: Not assessed  Gypsy Decant 04/07/2018, 4:42 PM
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
## 2018-04-07 NOTE — Progress Notes (Signed)
Social Work Patient ID: Erik [name], male   DOB: Apr 15, 1965, 53 y.o.   MRN: [id number]  Per MD, pt's planned d/c for tomorrow has been placed on hold due to medical issues.  Have alerted pt and daughter as well as insurance CM.  Kennethia Lynes, LCSW

## 2018-04-08 ENCOUNTER — Inpatient Hospital Stay (HOSPITAL_COMMUNITY): Payer: BLUE CROSS/BLUE SHIELD | Admitting: Physical Therapy

## 2018-04-08 ENCOUNTER — Inpatient Hospital Stay (HOSPITAL_COMMUNITY): Payer: BLUE CROSS/BLUE SHIELD

## 2018-04-08 DIAGNOSIS — K224 Dyskinesia of esophagus: Secondary | ICD-10-CM

## 2018-04-08 MED ORDER — METHYLPREDNISOLONE 4 MG PO TBPK
8.0000 mg | ORAL_TABLET | Freq: Every evening | ORAL | Status: AC
  Administered 2018-04-08: 8 mg via ORAL

## 2018-04-08 MED ORDER — METHYLPREDNISOLONE 4 MG PO TBPK
4.0000 mg | ORAL_TABLET | ORAL | Status: AC
  Administered 2018-04-08: 4 mg via ORAL

## 2018-04-08 MED ORDER — METHYLPREDNISOLONE 4 MG PO TBPK
8.0000 mg | ORAL_TABLET | Freq: Every morning | ORAL | Status: AC
  Filled 2018-04-08 (×2): qty 21

## 2018-04-08 MED ORDER — METHYLPREDNISOLONE 4 MG PO TBPK: 4.0000 mg | ORAL_TABLET | ORAL | Status: AC

## 2018-04-08 MED ORDER — METHYLPREDNISOLONE 4 MG PO TBPK
4.0000 mg | ORAL_TABLET | Freq: Three times a day (TID) | ORAL | Status: AC
  Administered 2018-04-09 (×3): 4 mg via ORAL

## 2018-04-08 MED ORDER — SORBITOL 70 % SOLN
60.0000 mL | Freq: Once | Status: AC
  Administered 2018-04-08: 60 mL via ORAL
  Filled 2018-04-08: qty 60

## 2018-04-08 MED ORDER — METHYLPREDNISOLONE 4 MG PO TBPK
4.0000 mg | ORAL_TABLET | Freq: Four times a day (QID) | ORAL | Status: DC
  Administered 2018-04-10: 4 mg via ORAL

## 2018-04-08 MED ORDER — METHYLPREDNISOLONE 4 MG PO TBPK
8.0000 mg | ORAL_TABLET | Freq: Every evening | ORAL | Status: AC
  Administered 2018-04-09: 8 mg via ORAL

## 2018-04-08 NOTE — Progress Notes (Signed)
Physical Therapy Session Note  Patient Details  Name: Erik [name] MRN: [id number] Date of Birth: [birth year]  Today's Date: 04/08/2018 PT Individual Time: 1300-1400 PT Individual Time Calculation (min): 60 min   Short Term Goals: Week 4:  PT Short Term Goal 1 (Week 4): =LTG due to ELOS  Skilled Therapeutic Interventions/Progress Updates:    Pt received seated in bed, agreeable to PT session. No complaints of pain but does report ongoing nausea this date. Pt was able to eat some green beans for lunch which he reports is an improvement from previous days. Pt declines any OOB mobility due to ongoing nausea but agreeable to bed-level activity. Pt reports he performed his home stretching program this AM. Supine to long-sit with HOB elevated and use of bedrails with Supervision. Long-sitting balance in bed performing ball toss with SBA for sitting balance. Extensive education with patient and his daughter Erik [name] about positioning and pressure relief. Demonstrated how to don/doff PRAFO boots and explained purpose of wearing them when in bed at home and to wear tennis shoes when OOB and up in chair. Also reviewed how to perform safe transfers at home and to try and transfer from level surfaces when able (bed to w/c etc.). Pt left seated in bed with needs in reach, family present.  Therapy Documentation Precautions:  Precautions Precautions: Back Precaution Comments: TLSO worn when OOB Required Braces or Orthoses: Spinal Brace Spinal Brace: Thoracolumbosacral orthotic, Applied in sitting position Restrictions Weight Bearing Restrictions: No Pain: Pain Assessment Pain Scale: 0-10 Pain Score: 0-No pain    Therapy/Group: Individual Therapy  Peter Congo, PT, DPT  04/08/2018, 2:09 PM

## 2018-04-08 NOTE — Progress Notes (Signed)
Soap suds enema given per order, patient had a yellow, slimy  medium size BM. We continue to monitor.

## 2018-04-08 NOTE — Progress Notes (Signed)
Subjective: Feeling better after an enema.  There is a decline in his nausea and vomiting, but he still has some epigastric pain.  Objective: Vital signs in last 24 hours: Temp:  [97.6 F (36.4 C)-98.1 F (36.7 C)] 98.1 F (36.7 C) (01/07 1412) Pulse Rate:  [74-93] 74 (01/07 1412) Resp:  [16-18] 16 (01/07 1412) BP: (140-164)/(78-101) 164/100 (01/07 1416) SpO2:  [100 %] 100 % (01/07 1412) Last BM Date: 04/08/18  Intake/Output from previous day: 01/06 0701 - 01/07 0700 In: 2000 [P.O.:150; I.V.:1350; IV Piggyback:500] Out: 750 [Urine:750] Intake/Output this shift: Total I/O In: 645 [P.O.:270; I.V.:225; IV Piggyback:150] Out: 250 [Urine:250]  General appearance: alert and no distress GI: outwardly protruding xiphoid process, tender in this region  Lab Results: Recent Labs    04/07/18 0643  WBC 4.3  HGB 13.4  HCT 39.7  PLT 334   BMET Recent Labs    04/07/18 0643  NA 133*  K 4.0  CL 104  CO2 22  GLUCOSE 101*  BUN 9  CREATININE 0.80  CALCIUM 9.2   LFT No results for input(s): PROT, ALBUMIN, AST, ALT, ALKPHOS, BILITOT, BILIDIR, IBILI in the last 72 hours. PT/INR No results for input(s): LABPROT, INR in the last 72 hours. Hepatitis Panel No results for input(s): HEPBSAG, HCVAB, HEPAIGM, HEPBIGM in the last 72 hours. C-Diff No results for input(s): CDIFFTOX in the last 72 hours. Fecal Lactopherrin No results for input(s): FECLLACTOFRN in the last 72 hours.  Studies/Results: Dg Abd 1 View  Result Date: 04/08/2018 CLINICAL DATA:  Check for barium prior to CT.  Recent esophagram. EXAM: ABDOMEN - 1 VIEW COMPARISON:  CT topogram 04/07/2018 FINDINGS: There continues to be a large amount of barium throughout the colon including the appendix. No significant barium at the rectum. The colonic barium distribution is unchanged from the study 04/07/2018. Small amount of gas in the stomach. Surgical screws and rods in the thoracolumbar spine. IMPRESSION: Persistent barium in the  colon. Barium distribution has not changed from the exam on 04/07/2018. Electronically Signed   By: Richarda Overlie M.D.   On: 04/08/2018 12:48   US Abdomen Limited Ruq  Result Date: 04/08/2018 CLINICAL DATA:  53 year old male with nausea and vomiting for the past week. Initial encounter. EXAM: ULTRASOUND ABDOMEN LIMITED RIGHT UPPER QUADRANT COMPARISON:  Report from 07/12/2008 ultrasound. FINDINGS: Gallbladder: Gallbladder is full. Sludge noted. No discrete gallstone detected. No gallbladder wall thickening identified. Patient not tender over the gallbladder during scanning per sonographer. Common bile duct: Diameter: 4.4 Liver: No focal lesion identified. Within normal limits in parenchymal echogenicity. Portal vein is patent on color Doppler imaging with normal direction of blood flow towards the liver. IMPRESSION: Gallbladder sludge. No ultrasound findings to suggest gallbladder inflammation. Otherwise negative. Electronically Signed   By: Lacy Duverney M.D.   On: 04/08/2018 07:25    Medications:  Scheduled: . bisacodyl  10 mg Rectal Q0600  . enoxaparin (LOVENOX) injection  30 mg Subcutaneous Q12H  . methylPREDNISolone  4 mg Oral PC lunch  . methylPREDNISolone  4 mg Oral PC supper  . [START ON 04/09/2018] methylPREDNISolone  4 mg Oral 3 x daily with food  . [START ON 04/10/2018] methylPREDNISolone  4 mg Oral 4X daily taper  . methylPREDNISolone  8 mg Oral AC breakfast  . methylPREDNISolone  8 mg Oral Nightly  . [START ON 04/09/2018] methylPREDNISolone  8 mg Oral Nightly  . metoCLOPramide (REGLAN) injection  5 mg Intravenous Q6H  . pantoprazole  40 mg Oral Daily  .  saccharomyces boulardii  250 mg Oral BID  . senna-docusate  2 tablet Oral QHS  . sorbitol  60 mL Oral Once   Continuous: . cefTRIAXone (ROCEPHIN)  IV 1 g (04/07/18 1700)  . dextrose 5 % and 0.9 % NaCl with KCl 20 mEq/L 75 mL/hr at 04/08/18 1302  . pantoprazole (PROTONIX) IV 80 mg (04/08/18 1017)    Assessment/Plan: 1) ? Xiphoid  pain. 2) Nausea/vomiting improved.   Examination of the epigastric region showed the pain was right behind the xiphoid process.  This is suspicious for a musculoskeletal source of pain.  Palpation in the soft tissue of the epigastric region did not elicit any pain versus manipulation and palpation in and around the xiphoid.  Plan: 1) Trial of a Medrol dose pack. 2) Continue with a PPI for now.  LOS: 29 days   Raizel Wesolowski D 04/08/2018, 3:26 PM

## 2018-04-08 NOTE — Progress Notes (Signed)
Informed Dr Riley Kill in rounds that pt is c/o pain right at epigastric area but feels better in his abdomen, pt bp is now 164/100, pt has not had high bp's in the past. Order rec'd for sorbitol po to assist with removal of excess barium.

## 2018-04-08 NOTE — Patient Care Conference (Signed)
Inpatient RehabilitationTeam Conference and Plan of Care Update Date: 04/08/2018   Time: 2:00 PM    Patient Name: Erik [name]      Medical Record Number: [id number]  Date of Birth: Aug 30, 1965 Sex: Male         Room/Bed: 4W16C/4W16C-01 Payor Info: Payor: BLUE CROSS BLUE SHIELD / Plan: BCBS OTHER / Product Type: *No Product type* /    Admitting Diagnosis: Paraplegia  Admit Date/Time:  03/10/2018  1:23 PM Admission Comments: No comment available   Primary Diagnosis:  Closed fracture of T11 vertebra with spinal cord injury (HCC) Principal Problem: Closed fracture of T11 vertebra with spinal cord injury [facility])  Patient Active Problem List   Diagnosis Date Noted  . Closed fracture of T11 vertebra with spinal cord injury (HCC) 03/10/2018  . Acute blood loss anemia   . Orthostasis   . Neurogenic bladder   . Neurogenic bowel   . Paraplegia [facility])     Expected Discharge Date: Expected Discharge Date: (TBD)  Team Members Present: Physician leading conference: Dr. Faith Rogue Social Worker Present: Amada Jupiter, LCSW Nurse Present: Willey Blade, RN PT Present: Other (comment)(Taylor Serita Grit, PT) OT Present: Other (comment)(Sandra Earlene Plater, OT) SLP Present: Colin Benton, SLP PPS Coordinator present : Fae Pippin     Current Status/Progress Goal Weekly Team Focus  Medical   Patient with development of nausea and vomiting last week.  Work-up really unremarkable thus far.  There is some concern about an esophageal process versus something lower in his GI tract.  Work-up underway, GI medicine consulted  Stabilized medically for discharge  Treating UTI, see information regarding gastrointestinal work-up above   Bowel/Bladder   Incontinent of bowel/bladder. Bowel program at 6 am LBM 04/07/18  Patient to be able to normal bowel/bladder function with limited assist  Assist with bowel/bladder need   Swallow/Nutrition/ Hydration             ADL's   s- min guard for transfers with slide board,  self care from bed level with set up A and use of circle sitting/long sitting  mod I overall from wheelchair level with use of slide board for functional transfers  pt/family edu, d/c planning, ADL retraining, balance, strengthening   Mobility   indep bed mobility, SBA to CGA SB transfers, mod I w/c mobility  Mod I with all transfers and w/c mobility  transfers, sitting balance, SCI edu, limited by ongoing nausea/vomiting this past week   Communication             Safety/Cognition/ Behavioral Observations            Pain   Denied pain  <2  Assess and treat pain q shift and as needed   Skin   Skin tear to right butt with foam dsg  Maintain skin intergity with min assist  Assess skin q shift and as needed    Rehab Goals Patient on target to meet rehab goals: Yes *See Care Plan and progress notes for long and short-term goals.     Barriers to Discharge  Current Status/Progress Possible Resolutions Date Resolved   Physician    Medical stability        See above      Nursing                  PT  Medical stability                 OT  SLP                SW                Discharge Planning/Teaching Needs:  Home with girlfriend who can provide intermittent assist.    Teaching has been completed with daughter.   Team Discussion:  Pt with ongoing n/v, however, improving overall. Checking KUB, CT and still attempting to identify the source.  Some improvement with appetite.  Sheering wound on buttocks - monitor.  Continue to build strength that was lost from several days illness in sitting balance and transfer.  D/C date still under consideration.  Revisions to Treatment Plan:  NA    Continued Need for Acute Rehabilitation Level of Care: The patient requires daily medical management by a physician with specialized training in physical medicine and rehabilitation for the following conditions: Daily direction of a multidisciplinary physical rehabilitation program to  ensure safe treatment while eliciting the highest outcome that is of practical value to the patient.: Yes Daily medical management of patient stability for increased activity during participation in an intensive rehabilitation regime.: Yes Daily analysis of laboratory values and/or radiology reports with any subsequent need for medication adjustment of medical intervention for : Other;Urological problems;Nutritional problems   I attest that I was present, lead the team conference, and concur with the assessment and plan of the team.   Cree Napoli 04/08/2018, 4:03 PM

## 2018-04-08 NOTE — Progress Notes (Signed)
Patient continue to deny PO meds. We continue  to monitor

## 2018-04-08 NOTE — Progress Notes (Signed)
[facility] PHYSICAL MEDICINE & REHABILITATION PROGRESS NOTE   Subjective/Complaints:   States that he feels better this morning after receiving enemas and having the contrast out of GI tract.  Has appetite to eat and drink something this morning.  ROS: Patient denies fever, rash, sore throat, blurred vision,  cough, shortness of breath or chest pain, joint or back pain, headache, or mood change.  Objective:   US Abdomen Limited Ruq  Result Date: 04/08/2018 CLINICAL DATA:  54 year old male with nausea and vomiting for the past week. Initial encounter. EXAM: ULTRASOUND ABDOMEN LIMITED RIGHT UPPER QUADRANT COMPARISON:  Report from 07/12/2008 ultrasound. FINDINGS: Gallbladder: Gallbladder is full. Sludge noted. No discrete gallstone detected. No gallbladder wall thickening identified. Patient not tender over the gallbladder during scanning per sonographer. Common bile duct: Diameter: 4.4 Liver: No focal lesion identified. Within normal limits in parenchymal echogenicity. Portal vein is patent on color Doppler imaging with normal direction of blood flow towards the liver. IMPRESSION: Gallbladder sludge. No ultrasound findings to suggest gallbladder inflammation. Otherwise negative. Electronically Signed   By: Lacy Duverney M.D.   On: 04/08/2018 07:25   Recent Labs    04/07/18 0643  WBC 4.3  HGB 13.4  HCT 39.7  PLT 334   Recent Labs    04/07/18 0643  NA 133*  K 4.0  CL 104  CO2 22  GLUCOSE 101*  BUN 9  CREATININE 0.80  CALCIUM 9.2    Intake/Output Summary (Last 24 hours) at 04/08/2018 0914 Last data filed at 04/08/2018 0054 Gross per 24 hour  Intake 1750 ml  Output 750 ml  Net 1000 ml     Physical Exam: Vital Signs Blood pressure (!) 140/94, pulse 93, temperature 97.9 F (36.6 C), temperature source Oral, resp. rate 18, weight 69.8 kg, SpO2 100 %. Constitutional: No distress . Vital signs reviewed. HEENT: EOMI, oral membranes moist Neck: supple Cardiovascular: RRR without  murmur. No JVD    Respiratory: CTA Bilaterally without wheezes or rales. Normal effort    GI: BS +, non-tender, non-distended  Musc: No edema or tenderness in extremities. Musculoskeletal: Lower extremity edema Neurological: He is alert and oriented  Motor: Bilateral upper extremities: 5/5 proximal to distal, no motor changes Bilateral lower extremities: 0/5 proximal distal, no motor changes Skin: Skin is warm and dry. He is not diaphoretic.  Back incision remains CDI Psychiatric: Mood a bit more upbeat today  Assessment/Plan: 1. Functional deficits secondary to thoracic spinal cord injury which require 3+ hours per day of interdisciplinary therapy in a comprehensive inpatient rehab setting.  Physiatrist is providing close team supervision and 24 hour management of active medical problems listed below.  Physiatrist and rehab team continue to assess barriers to discharge/monitor patient progress toward functional and medical goals  Care Tool:  Bathing  Bathing activity did not occur: Refused Body parts bathed by patient: Right arm, Left arm, Chest, Abdomen, Front perineal area, Right upper leg, Left upper leg, Right lower leg, Left lower leg, Face, Buttocks   Body parts bathed by helper: Buttocks Body parts n/a: Front perineal area   Bathing assist Assist Level: Set up assist     Upper Body Dressing/Undressing Upper body dressing   What is the patient wearing?: Pull over shirt, Orthosis Orthosis activity level: Performed by patient  Upper body assist Assist Level: Set up assist    Lower Body Dressing/Undressing Lower body dressing      What is the patient wearing?: Pants, Incontinence brief, Orthosis     Lower  body assist Assist for lower body dressing: Set up assist     Toileting Toileting Toileting Activity did not occur (Clothing management and hygiene only): N/A (no void or bm)  Toileting assist Assist for toileting: Moderate Assistance - Patient 50 - 74%      Transfers Chair/bed transfer  Transfers assist     Chair/bed transfer assist level: Moderate Assistance - Patient 50 - 74%     Locomotion Ambulation   Ambulation assist   Ambulation activity did not occur: Safety/medical concerns          Walk 10 feet activity   Assist  Walk 10 feet activity did not occur: Safety/medical concerns        Walk 50 feet activity   Assist Walk 50 feet with 2 turns activity did not occur: Safety/medical concerns         Walk 150 feet activity   Assist Walk 150 feet activity did not occur: Safety/medical concerns         Walk 10 feet on uneven surface  activity   Assist Walk 10 feet on uneven surfaces activity did not occur: Safety/medical concerns         Wheelchair     Assist Will patient use wheelchair at discharge?: Yes Type of Wheelchair: Manual    Wheelchair assist level: Independent Max wheelchair distance: 150 ft     Wheelchair 50 feet with 2 turns activity    Assist        Assist Level: Independent   Wheelchair 150 feet activity     Assist     Assist Level: Independent     Medical Problem List and Plan: 1.  Deficits with mobility, transfers, self-care secondary to motor-sensory complete thoracic spinal cord injury with paraplegia  -Continue activities as tolerated.  Discharge pending resolution of GI issues as below 2.  DVT Prophylaxis/Anticoagulation: Pharmaceutical: Lovenox bid x 3 months.   -dopplers were negative  3. Pain Management: oxycodone prn.  4. Mood: continue to provide positive reinforcement  5. Neuropsych: This patient is capable of making decisions on his own behalf. 6. Skin/Wound Care: routine pressure relief measures.  7. Fluids/Electrolytes/Nutrition: poor po intake  -changed to lactose free diet  -simethicone for gas 8. ABLA: Hemoglobin 11.7 on 12/23 9. Neurogenic bladder: I/O caths as needed     -recurrent E coli UTI  -continue ceftriaxone as  below  -ditropan stopped    10 Neurogenic bowel:    -continue daily suppository for AM program  - fiber and probiotic     -resumed senna-s at HS  -had been on schedule until recent N/V began 11. Orthostatic symptoms?:   -continue  ACE/TEDs when up   abdominal binder if needed for hypotension, discussed use of abdominal binder when standing.   12.  Left knee effusion-fibular fx, noted on Xray  -folllow up xrays demonstrate early callus formation  -no bracing needed. Transfer/movement as tolerated 13. Nausea: still of unclear origin   -labs, imaging essentially normal so far   -meds have been changed, adjusted which may have contributed to nausea. Dc'ed gabapentin and melatonin also (but hasn't taken for a few days)  -treating UTI  -continue IV protonix and reglan  - esophageal dysmotility issue  -abdominal discomfort remains also in hypogastric area---better after enemas?  --continue IVF  D5NS with Kcl  -push fluids and general po intake as possible  -U/S demonstrates gallbladder sludge  -  CT abdomen pending this morning  -appreciate Dr. Kenna GilbertMann's assistance  LOS: 29 days A FACE TO FACE EVALUATION WAS PERFORMED  Ranelle Oyster 04/08/2018, 9:14 AM

## 2018-04-08 NOTE — Progress Notes (Signed)
Occupational Therapy Session Note  Patient Details  Name: Erik [name] MRN: [id number] Date of Birth: 21-Apr-1965  Today's Date: 04/08/2018 OT Individual Time: 1015-1040  OT Individual Time Calculation (min): 25 min  and Today's Date: 04/08/2018 OT Missed Time: 50 Minutes Missed Time Reason: Patient ill (comment)   Session 2:  OT Individual Time: 1530-1615.  45 min, 15 min missed d/t pt ill (nausea)    Short Term Goals: Week 4:  OT Short Term Goal 1 (Week 4): STGs=LTGs secondary to upcoming discharge  Skilled Therapeutic Interventions/Progress Updates:    Session focused on shaving task at bed level. Pt with no report of pain, just fatigue and slight nausea supine in bed. Pt agreed to complete grooming tasks supine in bed. Set up provided for pt to complete full task, cueing initially for positioning. Attempted to get pt to participate in other bed level there-ex or ADL and pt politely refused. Pt was left supine in bed with all needs met, bed alarm set. 50 min skilled OT missed.   Session 2: Session focused on bed level therex to promote increase B UE strengthening/endurance. Pt c/o nausea and pain in his stomach, RN was alerted and administered tylenol- pt agreeable to work with therapy at bed level. Pt held a 2 lb weighted ball and completed a functional circuit focused on strengthening and conditioning the UE bimanually. Cueing provided to correct form and ensure proper technique throughout. After 45 min, pt reported he could do no more and requested to end. 15 min skilled OT missed. Pt left supine in bed with RN present.   Therapy Documentation Precautions:  Precautions Precautions: Back Precaution Comments: TLSO worn when OOB Required Braces or Orthoses: Spinal Brace Spinal Brace: Thoracolumbosacral orthotic, Applied in sitting position Restrictions Weight Bearing Restrictions: No General: General OT Amount of Missed Time: 50 Minutes  Pain: Pain Assessment Pain Scale: 0-10 Pain  Score: 0-No pain   Therapy/Group: Individual Therapy  Curtis Sites 04/08/2018, 10:54 AM

## 2018-04-09 ENCOUNTER — Inpatient Hospital Stay (HOSPITAL_COMMUNITY): Payer: BLUE CROSS/BLUE SHIELD

## 2018-04-09 ENCOUNTER — Inpatient Hospital Stay (HOSPITAL_COMMUNITY): Payer: BLUE CROSS/BLUE SHIELD | Admitting: Physical Therapy

## 2018-04-09 ENCOUNTER — Inpatient Hospital Stay (HOSPITAL_COMMUNITY): Payer: BLUE CROSS/BLUE SHIELD | Admitting: Occupational Therapy

## 2018-04-09 MED ORDER — METOCLOPRAMIDE HCL 5 MG PO TABS
5.0000 mg | ORAL_TABLET | Freq: Three times a day (TID) | ORAL | Status: DC
  Administered 2018-04-09 – 2018-04-11 (×7): 5 mg via ORAL
  Filled 2018-04-09 (×7): qty 1

## 2018-04-09 MED ORDER — PANTOPRAZOLE SODIUM 40 MG PO TBEC
40.0000 mg | DELAYED_RELEASE_TABLET | Freq: Two times a day (BID) | ORAL | Status: DC
  Administered 2018-04-09 – 2018-04-11 (×4): 40 mg via ORAL
  Filled 2018-04-09 (×4): qty 1
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
## 2018-04-09 NOTE — Progress Notes (Signed)
Erik [name] PHYSICAL MEDICINE & REHABILITATION PROGRESS NOTE   Subjective/Complaints:   Erik [name] states that he feels better after evacuating the remaining contrast. I asked him if he was having any sternal/xyphoid pain, and he admitted that he did but that it was mild. (he has had some musculoskeletal pain due to using UE/chest for transfers and w/c propulsion  ROS: Patient denies fever, rash, sore throat, blurred vision,  cough, shortness of breath or chest pain, joint or back pain, headache, or mood change.    Objective:   Dg Abd 1 View  Result Date: 04/08/2018 CLINICAL DATA:  Check for barium prior to CT.  Recent esophagram. EXAM: ABDOMEN - 1 VIEW COMPARISON:  CT topogram 04/07/2018 FINDINGS: There continues to be a large amount of barium throughout the colon including the appendix. No significant barium at the rectum. The colonic barium distribution is unchanged from the study 04/07/2018. Small amount of gas in the stomach. Surgical screws and rods in the thoracolumbar spine. IMPRESSION: Persistent barium in the colon. Barium distribution has not changed from the exam on 04/07/2018. Electronically Signed   By: Richarda Overlie M.D.   On: 04/08/2018 12:48   US Abdomen Limited Ruq  Result Date: 04/08/2018 CLINICAL DATA:  53 year old male with nausea and vomiting for the past week. Initial encounter. EXAM: ULTRASOUND ABDOMEN LIMITED RIGHT UPPER QUADRANT COMPARISON:  Report from 07/12/2008 ultrasound. FINDINGS: Gallbladder: Gallbladder is full. Sludge noted. No discrete gallstone detected. No gallbladder wall thickening identified. Patient not tender over the gallbladder during scanning per sonographer. Common bile duct: Diameter: 4.4 Liver: No focal lesion identified. Within normal limits in parenchymal echogenicity. Portal vein is patent on color Doppler imaging with normal direction of blood flow towards the liver. IMPRESSION: Gallbladder sludge. No ultrasound findings to suggest gallbladder inflammation.  Otherwise negative. Electronically Signed   By: Lacy Duverney M.D.   On: 04/08/2018 07:25   Recent Labs    04/07/18 0643  WBC 4.3  HGB 13.4  HCT 39.7  PLT 334   Recent Labs    04/07/18 0643  NA 133*  K 4.0  CL 104  CO2 22  GLUCOSE 101*  BUN 9  CREATININE 0.80  CALCIUM 9.2    Intake/Output Summary (Last 24 hours) at 04/09/2018 0910 Last data filed at 04/09/2018 0644 Gross per 24 hour  Intake 1975 ml  Output 1150 ml  Net 825 ml     Physical Exam: Vital Signs Blood pressure (!) 142/84, pulse 85, temperature 97.7 F (36.5 C), temperature source Oral, resp. rate 18, height 5\' 6"  (1.676 m), weight 70.2 kg, SpO2 100 %. Constitutional: No distress . Vital signs reviewed. Looks better HEENT: EOMI, oral membranes moist Neck: supple Cardiovascular: RRR without murmur. No JVD    Respiratory: CTA Bilaterally without wheezes or rales. Normal effort    GI: BS +, non-tender, non-distended  Musc: No edema or tenderness in extremities. Mild tenderness with palpation of sternum/x Musculoskeletal: Lower extremity edema Neurological: He is alert and oriented  Motor: Bilateral upper extremities: 5/5 proximal to distal, no motor changes Bilateral lower extremities: 0/5 proximal distal, no motor changes Skin: Skin is warm and dry. He is not diaphoretic.  Back incision remains CDI Psychiatric: smiling and more like himself today  Assessment/Plan: 1. Functional deficits secondary to thoracic spinal cord injury which require 3+ hours per day of interdisciplinary therapy in a comprehensive inpatient rehab setting.  Physiatrist is providing close team supervision and 24 hour management of active medical problems listed below.  Physiatrist  and rehab team continue to assess barriers to discharge/monitor patient progress toward functional and medical goals  Care Tool:  Bathing  Bathing activity did not occur: Refused Body parts bathed by patient: Right arm, Left arm, Chest, Abdomen, Front  perineal area, Right upper leg, Left upper leg, Right lower leg, Left lower leg, Face, Buttocks   Body parts bathed by helper: Buttocks Body parts n/a: Front perineal area   Bathing assist Assist Level: Set up assist     Upper Body Dressing/Undressing Upper body dressing   What is the patient wearing?: Pull over shirt, Orthosis Orthosis activity level: Performed by patient  Upper body assist Assist Level: Set up assist    Lower Body Dressing/Undressing Lower body dressing      What is the patient wearing?: Pants, Incontinence brief, Orthosis     Lower body assist Assist for lower body dressing: Set up assist     Toileting Toileting Toileting Activity did not occur (Clothing management and hygiene only): N/A (no void or bm)  Toileting assist Assist for toileting: Moderate Assistance - Patient 50 - 74%     Transfers Chair/bed transfer  Transfers assist     Chair/bed transfer assist level: Moderate Assistance - Patient 50 - 74%     Locomotion Ambulation   Ambulation assist   Ambulation activity did not occur: Safety/medical concerns          Walk 10 feet activity   Assist  Walk 10 feet activity did not occur: Safety/medical concerns        Walk 50 feet activity   Assist Walk 50 feet with 2 turns activity did not occur: Safety/medical concerns         Walk 150 feet activity   Assist Walk 150 feet activity did not occur: Safety/medical concerns         Walk 10 feet on uneven surface  activity   Assist Walk 10 feet on uneven surfaces activity did not occur: Safety/medical concerns         Wheelchair     Assist Will patient use wheelchair at discharge?: Yes Type of Wheelchair: Manual    Wheelchair assist level: Independent Max wheelchair distance: 150 ft     Wheelchair 50 feet with 2 turns activity    Assist        Assist Level: Independent   Wheelchair 150 feet activity     Assist     Assist Level:  Independent     Medical Problem List and Plan: 1.  Deficits with mobility, transfers, self-care secondary to motor-sensory complete thoracic spinal cord injury with paraplegia  -   Discharge pending resolution of GI issues as below 2.  DVT Prophylaxis/Anticoagulation: Pharmaceutical: Lovenox bid x 3 months.   -dopplers were negative  3. Pain Management: oxycodone prn.  4. Mood: continue to provide positive reinforcement  5. Neuropsych: This patient is capable of making decisions on his own behalf. 6. Skin/Wound Care: routine pressure relief measures.  7. Fluids/Electrolytes/Nutrition: poor po intake  -changed to lactose free diet  -simethicone for gas 8. ABLA: Hemoglobin 11.7 on 12/23 9. Neurogenic bladder: I/O caths as needed     -recurrent E coli UTI  -dc abx today  -ditropan stopped    10 Neurogenic bowel:    -continue daily suppository for AM program  - fiber and probiotic     -resumed senna-s at HS  -had been on schedule until recent N/V began 11. Orthostatic symptoms?:   -continue  ACE/TEDs when up  abdominal binder if needed for hypotension, discussed use of abdominal binder when standing.   12.  Left knee effusion-fibular fx, noted on Xray  -folllow up xrays demonstrate early callus formation  -no bracing needed. Transfer/movement as tolerated 13. Nausea: still of unclear origin   -labs, imaging essentially normal so far   -meds have been changed, adjusted which may have contributed to nausea. Dc'ed gabapentin and melatonin also (but hasn't taken for a few days)  -treating UTI  -continue  protonix and reglan but change to PO  - esophageal dysmotility issue  -abdominal discomfort remains also in hypogastric area---again better after last evacuation of contrast  -GI rx'ed steroid for xyphoid pain......  --dc IVF  -push fluids and general po intake as possible  -U/S demonstrates gallbladder sludge  -  CT abdomen pending--- I would like this completed  -appreciate GI  assistance    Mobility Face to Face Assessment  Erik [name] was seen today for the purpose of a mobility assessment for a  wheelchair. I have reviewed and agree with the detailed Erik [name] evaluation. He suffers from paraplegia related to thoracic spinal cord injury. Due to his paraplegia, he is unable to utilize a cane, or walker.  The patient is appropriate for an ultra light manual wheelchair.  With this wheel chair he can move independently at a household level and  in the community. The chair will also allow the patient to perform ADL's more easily. The patient is motivated to utilize the chair on a daily basis.     Erik OysterZachary T. Camry Robello, MD, [facility] HospitalFAAPMR [facility]    LOS: 30 days [facility]  Erik OysterZachary T Barnett [name] 04/09/2018, 9:10 AM

## 2018-04-09 NOTE — Progress Notes (Signed)
Occupational Therapy Session Note  Patient Details  Name: Erik [name] MRN: [id number] Date of Birth: 02/19/1966  Today's Date: 04/09/2018 OT Individual Time: 0700-0802 OT Individual Time Calculation (min): 62 min  and Today's Date: 04/09/2018 OT Missed Time: 13 Minutes Missed Time Reason: Other (comment)(bedpan)   Short Term Goals: Week 4:  OT Short Term Goal 1 (Week 4): STGs=LTGs secondary to upcoming discharge  Skilled Therapeutic Interventions/Progress Updates:    Upon entering the room, pt supine in bed sleeping soundly. Pt was agreeable to OT intervention. OT and pt discussed home set up and concerns within that environment since pt will be discharging to new house. Ramp is built and pt reports no concerns. Hospital bed has been delivered as well as slide board. OT assisted pt with donning B thigh high TED hose. Pt declined bathing this session. When pt rolling to fasten brief OT noted he was incontinent of bowel. Pt rolling independently with use of bed rail and therapist placed bed pan. Pt having multiple bouts of loose stool. Pt remaining on bed pan per pt request as he continues to have multiple movements. RN notified. Call bell within reach as OT exited the room.  Therapy Documentation Precautions:  Precautions Precautions: Back Precaution Comments: TLSO worn when OOB Required Braces or Orthoses: Spinal Brace Spinal Brace: Thoracolumbosacral orthotic, Applied in sitting position Restrictions Weight Bearing Restrictions: No General: General OT Amount of Missed Time: 13 Minutes ADL: ADL Upper Body Bathing: Setup Where Assessed-Upper Body Bathing: Bed level Lower Body Bathing: Minimal assistance Where Assessed-Lower Body Bathing: Bed level Upper Body Dressing: Setup Where Assessed-Upper Body Dressing: Bed level Lower Body Dressing: Minimal assistance Where Assessed-Lower Body Dressing: Bed level Toileting: Minimal assistance Where Assessed-Toileting: Bed level Toilet  Transfer: Moderate assistance Toilet Transfer Method: Scientist, research (life sciences): Bedside commode, Drop arm bedside commode Tub/Shower Transfer: Not assessed   Therapy/Group: Individual Therapy  [name] 04/09/2018, 10:39 AM

## 2018-04-09 NOTE — Progress Notes (Signed)
Occupational Therapy Session Note  Patient Details  Name: Erik [name] MRN: [id number] Date of Birth: [birth year]  Today's Date: 04/09/2018 OT Individual Time: 2440-1027 OT Individual Time Calculation (min): 85 min    Short Term Goals: Week 2:  OT Short Term Goal 1 (Week 2): Pt will perform toileting with mod A on BSC. OT Short Term Goal 1 - Progress (Week 2): Met(simulated) OT Short Term Goal 2 (Week 2): Pt will come into long sitting position from flat surface without use of bed rails and min guard for balance.  OT Short Term Goal 2 - Progress (Week 2): Met OT Short Term Goal 3 (Week 2): Pt will utilize circle sitting and long sitting positions with close supervision for balance while performing LB self care without use of bedrail. OT Short Term Goal 3 - Progress (Week 2): Met  Skilled Therapeutic Interventions/Progress Updates:    1:1. Pt reporting rib pain during functional mobility, however declines intervention at this time. RN aware. Pt actively vomiting upon arrival. RN aware. BP assessed during session 154/95. RN aware. Pt seated on bed pan reporting protonix makes him vomit.  Pt agreeale to intervention to remove bed pan. Pt already wearing teds. Total A provided for posterior hygiene with rolling and donning clean brief to avoid soiling bed sheets. Pt dons pants with supervision/VC in long sitting and rolling. Pt dons socks/leg loops in long sitting. Pt dons abdominal binder and brace with supervision. Pt completes slide board transfer to w/c with VC for head hips relationship and proper placement of board with [place]. Pt propels w/c >400 feet for BUE strengthening and endurance in hallways per pt request as pt has been laying in bed for majprity of week wanting to improve endurance. Exited session with pt seated in w/c, call light in reach awaiting next session  Therapy Documentation Precautions:  Precautions Precautions: Back Precaution Comments: TLSO worn when OOB Required  Braces or Orthoses: Spinal Brace Spinal Brace: Thoracolumbosacral orthotic, Applied in sitting position Restrictions Weight Bearing Restrictions: No General:   Vital Signs:  Pain:   ADL: ADL Upper Body Bathing: Setup Where Assessed-Upper Body Bathing: Bed level Lower Body Bathing: Minimal assistance Where Assessed-Lower Body Bathing: Bed level Upper Body Dressing: Setup Where Assessed-Upper Body Dressing: Bed level Lower Body Dressing: Minimal assistance Where Assessed-Lower Body Dressing: Bed level Toileting: Minimal assistance Where Assessed-Toileting: Bed level Toilet Transfer: Moderate assistance Toilet Transfer Method: Theatre manager: Bedside commode, Drop arm bedside commode Tub/Shower Transfer: Not assessed Vision   Perception    Praxis   Exercises:   Other Treatments:     Therapy/Group: Individual Therapy  [name] 04/09/2018, 9:43 AM

## 2018-04-09 NOTE — Progress Notes (Signed)
Physical Therapy Session Note  Patient Details  Name: Erik [name] MRN: [id number] Date of Birth: July 25, 1965  Today's Date: 04/09/2018 PT Individual Time: 1000-1100 PT Individual Time Calculation (min): 60 min   Short Term Goals: Week 4:  PT Short Term Goal 1 (Week 4): =LTG due to ELOS  Skilled Therapeutic Interventions/Progress Updates:    Pt received seated in w/c in room, agreeable to PT. No complaints of pain but pt does report mild nausea ongoing this date. Manual w/c propulsion x 1000 ft with BUE navigating hospital hallways, doorways, up/down ramp, and navigating obstacles independently. Pt fatigues quickly with w/c propulsion and requires several rest breaks this date. Slide board transfer w/c to/from mat table with CGA for trunk control. Sitting balance EOM with SBA to CGA while performing alt UE raises and reaches outside BOS. Pt with elevated BP this date, 152/108 but pt remains asymptomatic throughout session. Also provided handout to patient for SCI support group in the area, pt appreciative of information. Pt left seated in w/c in room with needs in reach at end of therapy session.  Therapy Documentation Precautions:  Precautions Precautions: Back Precaution Comments: TLSO worn when OOB Required Braces or Orthoses: Spinal Brace Spinal Brace: Thoracolumbosacral orthotic, Applied in sitting position Restrictions Weight Bearing Restrictions: No   Therapy/Group: Individual Therapy  Peter Congo, PT, DPT  04/09/2018, 11:16 AM

## 2018-04-10 ENCOUNTER — Inpatient Hospital Stay (HOSPITAL_COMMUNITY): Payer: BLUE CROSS/BLUE SHIELD | Admitting: Physical Therapy

## 2018-04-10 ENCOUNTER — Ambulatory Visit (HOSPITAL_COMMUNITY): Payer: BLUE CROSS/BLUE SHIELD | Admitting: Physical Therapy

## 2018-04-10 ENCOUNTER — Inpatient Hospital Stay (HOSPITAL_COMMUNITY): Payer: BLUE CROSS/BLUE SHIELD | Admitting: Occupational Therapy

## 2018-04-10 MED ORDER — LISINOPRIL 10 MG PO TABS: 10.0000 mg | ORAL_TABLET | Freq: Every day | ORAL | 0 refills | Status: DC

## 2018-04-10 MED ORDER — CYCLOBENZAPRINE HCL 5 MG PO TABS: 5.0000 mg | ORAL_TABLET | Freq: Three times a day (TID) | ORAL | 0 refills | Status: DC | PRN

## 2018-04-10 MED ORDER — ACETAMINOPHEN 325 MG PO TABS: 325.0000 mg | ORAL_TABLET | ORAL | Status: AC | PRN

## 2018-04-10 MED ORDER — LISINOPRIL 10 MG PO TABS
10.0000 mg | ORAL_TABLET | Freq: Every day | ORAL | Status: DC
  Administered 2018-04-10 – 2018-04-11 (×2): 10 mg via ORAL
  Filled 2018-04-10 (×2): qty 1

## 2018-04-10 MED ORDER — ENOXAPARIN (LOVENOX) PATIENT EDUCATION KIT
PACK | Freq: Once | Status: DC
  Filled 2018-04-10: qty 1

## 2018-04-10 MED ORDER — GABAPENTIN 100 MG PO CAPS: 100.0000 mg | ORAL_CAPSULE | Freq: Three times a day (TID) | ORAL | 0 refills | Status: DC

## 2018-04-10 MED ORDER — GABAPENTIN 100 MG PO CAPS
100.0000 mg | ORAL_CAPSULE | Freq: Three times a day (TID) | ORAL | Status: DC
  Administered 2018-04-10 – 2018-04-11 (×4): 100 mg via ORAL
  Filled 2018-04-10 (×4): qty 1

## 2018-04-10 MED ORDER — SACCHAROMYCES BOULARDII 250 MG PO CAPS: 250.0000 mg | ORAL_CAPSULE | Freq: Two times a day (BID) | ORAL | 0 refills | Status: AC

## 2018-04-10 MED ORDER — METOCLOPRAMIDE HCL 5 MG PO TABS: 5.0000 mg | ORAL_TABLET | Freq: Three times a day (TID) | ORAL | 0 refills | Status: DC

## 2018-04-10 MED ORDER — PANTOPRAZOLE SODIUM 40 MG PO TBEC: 40.0000 mg | DELAYED_RELEASE_TABLET | Freq: Two times a day (BID) | ORAL | 0 refills | Status: DC

## 2018-04-10 MED ORDER — ENOXAPARIN SODIUM 30 MG/0.3ML ~~LOC~~ SOLN: 30.0000 mg | Freq: Two times a day (BID) | SUBCUTANEOUS | 0 refills | Status: DC

## 2018-04-10 MED ORDER — SENNOSIDES-DOCUSATE SODIUM 8.6-50 MG PO TABS: 2.0000 | ORAL_TABLET | Freq: Every evening | ORAL | 0 refills | Status: DC | PRN

## 2018-04-10 MED ORDER — BISACODYL 10 MG RE SUPP: 10.0000 mg | Freq: Every day | RECTAL | 0 refills | Status: AC

## 2018-04-10 MED ORDER — TRAMADOL HCL 50 MG PO TABS: 50.0000 mg | ORAL_TABLET | Freq: Four times a day (QID) | ORAL | 0 refills | Status: DC | PRN

## 2018-04-10 MED ORDER — ONDANSETRON HCL 4 MG PO TABS: 4.0000 mg | ORAL_TABLET | Freq: Four times a day (QID) | ORAL | 0 refills | Status: AC | PRN

## 2018-04-10 NOTE — Progress Notes (Signed)
Occupational Therapy Discharge Summary  Patient Details  Name: Erik [name] MRN: [id number] Date of Birth: 05/03/1965  Today's Date: 04/10/2018 OT Individual Time: 0109-3235 and 1400-1500 OT Individual Time Calculation (min): 75 min and 60 min    Patient has met 6 of 8 long term goals due to improved activity tolerance, improved balance, postural control, ability to compensate for deficits and improved coordination.  Patient to discharge at overall mod I - min A level.  Patient's care partner is independent to provide the necessary physical assistance at discharge.  His daughters have been present for hands on family education.   Reasons goals not met: toilet transfer onto padded tub bench with slide board and min A. Steady assistance for balance with clothing management and hygiene.   Recommendation:  Patient will benefit from ongoing skilled OT services in home health setting to continue to advance functional skills in the area of BADL and iADL.  Equipment: hospital bed and slide board. OT recommended purchase of padded tub bench with cut out for toileting needs  Reasons for discharge: treatment goals met  Patient/family agrees with progress made and goals achieved: Yes   OT Intervention: Session 1: Upon entering the room, pt demonstrated ability to independently bath himself from bed level with use of basin, long sitting, and circle sitting positions. Pt donning clothing items as well as TLSO, leg loops, and L knee brace independently with increased time. OT reviewed pt's progress and recommendations for home health OT. Pt agreeable and with no further concerns at this time. Pt requesting to remain in bed as he has upcoming PT appointment. Call bell and all needed items within reach.   Session 2: Upon entering the room, pt seated in wheelchair with no c/o pain and agreeable to OT intervention. Pt transferred from wheelchair >bed with use of slide board and supervision. Pt then transferred  from bed > TTB to practice toileting transfer in same manner. Pt requiring min A to return back to bed as he had difficulty moving LEs from cut out and felt "stuck." OT recommended pt keep pants on when transferring onto and off of TTB. During transfer, pt having BM and able to perform clothing management and hygiene with set up A to obtain all needed items. Pt remaining in bed at end of session secondary to fatigue. Call bell and all needed items within reach upon exiting the room.   OT Discharge Precautions/Restrictions  Precautions Precautions: Back Precaution Comments: TLSO worn when OOB Required Braces or Orthoses: Spinal Brace Spinal Brace: Thoracolumbosacral orthotic;Applied in sitting position Restrictions Weight Bearing Restrictions: No Vital Signs Therapy Vitals Temp: 97.7 F (36.5 C) Pulse Rate: 85 Resp: 16 BP: (!) 147/98 Patient Position (if appropriate): Lying Oxygen Therapy SpO2: 100 % O2 Device: Room Air Pain Pain Assessment Pain Scale: 0-10 Pain Score: 0-No pain ADL ADL Upper Body Bathing: Setup Where Assessed-Upper Body Bathing: Bed level Lower Body Bathing: Minimal assistance Where Assessed-Lower Body Bathing: Bed level Upper Body Dressing: Setup Where Assessed-Upper Body Dressing: Bed level Lower Body Dressing: Minimal assistance Where Assessed-Lower Body Dressing: Bed level Toileting: Minimal assistance Where Assessed-Toileting: Bed level Toilet Transfer: Moderate assistance Toilet Transfer Method: Theatre manager: Bedside commode, Drop arm bedside commode Tub/Shower Transfer: Not assessed Vision Baseline Vision/History: Wears glasses Wears Glasses: Reading only Eye Alignment: Within Functional Limits Perception  Perception: Within Functional Limits Praxis Praxis: Intact Cognition Overall Cognitive Status: Within Functional Limits for tasks assessed Arousal/Alertness: Awake/alert Orientation Level: Oriented  X4 Attention: Sustained  Focused Attention: Appears intact Sustained Attention: Appears intact Memory: Appears intact Awareness: Appears intact Problem Solving: Appears intact Safety/Judgment: Appears intact Sensation Sensation Light Touch: Impaired Detail Light Touch Impaired Details: Impaired RLE;Impaired LLE Hot/Cold: Impaired Detail Hot/Cold Impaired Details: Impaired RLE;Impaired LLE Proprioception: Impaired Detail Proprioception Impaired Details: Absent RLE;Absent LLE Coordination Gross Motor Movements are Fluid and Coordinated: No Fine Motor Movements are Fluid and Coordinated: Yes Coordination and Movement Description: impaired trunk control Heel Shin Test: unable to assess due to paraplegia Motor  Motor Motor: Paraplegia;Abnormal tone;Abnormal postural alignment and control Motor - Skilled Clinical Observations: T10/11 paraplegia Motor - Discharge Observations: T10/11 paraplegia Mobility  Bed Mobility Bed Mobility: Rolling Right;Rolling Left;Supine to Sit Rolling Right: Independent with assistive device Rolling Left: Independent with assistive device Supine to Sit: Independent with assistive device  Trunk/Postural Assessment  Cervical Assessment Cervical Assessment: Within Functional Limits Thoracic Assessment Thoracic Assessment: Within Functional Limits Lumbar Assessment Lumbar Assessment: Within Functional Limits Postural Control Postural Control: Deficits on evaluation Trunk Control: impaired Righting Reactions: delayed Postural Limitations: impaired  Balance Balance Balance Assessed: Yes Static Sitting Balance Static Sitting - Balance Support: Feet supported;Bilateral upper extremity supported Static Sitting - Level of Assistance: 6: Modified independent (Device/Increase time) Dynamic Sitting Balance Dynamic Sitting - Balance Support: No upper extremity supported;Feet supported;During functional activity Dynamic Sitting - Level of Assistance: 6:  Modified independent (Device/Increase time) Extremity/Trunk Assessment RUE Assessment RUE Assessment: Within Functional Limits LUE Assessment LUE Assessment: Within Functional Limits   Gypsy Decant 04/10/2018, 5:08 PM

## 2018-04-10 NOTE — Progress Notes (Signed)
Writer reviewed procedure for Lovenox injections with this patient, he was able to return teach successfully and stated " I can do this without any problem". Patient was provided the Lovenox information kit from the pharmacy.

## 2018-04-10 NOTE — Progress Notes (Signed)
Physical Therapy Discharge Summary  Patient Details  Name: Erik [name] MRN: [id number] Date of Birth: Jun 05, 1965  Today's Date: 04/10/2018 PT Individual Time: 1300-1400 PT Individual Time Calculation (min): 60 min    Patient has met 6 of 8 long term goals due to improved activity tolerance, improved balance, improved postural control, increased strength and ability to compensate for deficits.  Patient to discharge at a wheelchair level Modified Independent for all w/c mobility and setup A for slide board transfers from various surfaces.   Patient's care partner is independent to provide the necessary physical assistance at discharge.  Reasons goals not met: Pt did not reach mod I with all transfers due to being ill with nausea/vomiting the week leading up to his discharge. Due to being sick pt missed therapy time and was unable to reach mod I level with transfers. Pt is at setup A level with transfers and pt's family is able to provide this level of assist at home.  Recommendation:  Patient will benefit from ongoing skilled PT services in home health setting to continue to advance safe functional mobility, address ongoing impairments in sitting balance, independence with functional mobility, and minimize fall risk.  Equipment: Automotive engineer wheelchair  Reasons for discharge: treatment goals met and discharge from hospital  Patient/family agrees with progress made and goals achieved: Yes   Skilled Intervention: Pt received seated in w/c in room, agreeable to PT session. No complaints of pain. Pt is mod I for all w/c mobility up/down ramps and across various surfaces navigating a functional environment. Pt demonstrates good safety awareness and management of w/c parts independently. Pt is setup A for slide board transfers to various height surfaces. Pt is mod I for all bed mobility with use of hospital bed features and bedrails. Provided pt with handout for sexuality following  a SCI. Pt appreciative of information. Pt is independent with home stretching program, pressure relief, etc. Reviewed importance of skin inspection and wearing shoes when up in w/c. Pt receptive and exhibits good understanding of all education provided. Pt left seated in w/c in room with needs in reach.  PT Discharge Precautions/Restrictions Precautions Precautions: Back Precaution Comments: TLSO worn when OOB Required Braces or Orthoses: Spinal Brace Spinal Brace: Thoracolumbosacral orthotic;Applied in sitting position Restrictions Weight Bearing Restrictions: No Vision/Perception  Vision - History Baseline Vision: Wears glasses only for reading Patient Visual Report: No change from baseline Vision - Assessment Eye Alignment: Within Functional Limits Perception Perception: Within Functional Limits Praxis Praxis: Intact  Cognition Overall Cognitive Status: Within Functional Limits for tasks assessed Arousal/Alertness: Awake/alert Orientation Level: Oriented X4 Attention: Sustained Focused Attention: Appears intact Sustained Attention: Appears intact Memory: Appears intact Awareness: Appears intact Problem Solving: Appears intact Safety/Judgment: Appears intact Sensation Sensation Light Touch: Impaired Detail Light Touch Impaired Details: Impaired RLE;Impaired LLE Hot/Cold: Impaired Detail Hot/Cold Impaired Details: Impaired RLE;Impaired LLE Proprioception: Impaired Detail Proprioception Impaired Details: Absent RLE;Absent LLE Coordination Gross Motor Movements are Fluid and Coordinated: No Fine Motor Movements are Fluid and Coordinated: Yes Coordination and Movement Description: impaired trunk control Heel Shin Test: unable to assess due to paraplegia Motor  Motor Motor: Paraplegia;Abnormal tone;Abnormal postural alignment and control Motor - Skilled Clinical Observations: T10/11 paraplegia Motor - Discharge Observations: T10/11 paraplegia  Mobility Bed  Mobility Bed Mobility: Rolling Right;Rolling Left;Supine to Sit Rolling Right: Independent with assistive device Rolling Left: Independent with assistive device Supine to Sit: Independent with assistive device Transfers Transfers: Lateral/Scoot Transfers Lateral/Scoot Transfers: Set up assist Transfer (Assistive device):  Other (Comment)(slide board) Locomotion  Gait Ambulation: No Gait Gait: No Stairs / Additional Locomotion Stairs: No Ramp: Independent with assistive Production manager Mobility: Yes Wheelchair Assistance: Independent with Camera operator: Both upper extremities Wheelchair Parts Management: Independent Distance: 1000 ft  Trunk/Postural Assessment  Cervical Assessment Cervical Assessment: Within Functional Limits Thoracic Assessment Thoracic Assessment: Within Functional Limits Lumbar Assessment Lumbar Assessment: Within Functional Limits Postural Control Postural Control: Deficits on evaluation Trunk Control: impaired Righting Reactions: delayed Postural Limitations: impaired  Balance Balance Balance Assessed: Yes Static Sitting Balance Static Sitting - Balance Support: Feet supported;Bilateral upper extremity supported Static Sitting - Level of Assistance: 6: Modified independent (Device/Increase time) Dynamic Sitting Balance Dynamic Sitting - Balance Support: No upper extremity supported;Feet supported;During functional activity Dynamic Sitting - Level of Assistance: 6: Modified independent (Device/Increase time) Extremity Assessment   RLE Assessment RLE Assessment: Exceptions to [facility] Passive Range of Motion (PROM) Comments: WFL General Strength Comments: 0/5  LLE Assessment LLE Assessment: Exceptions to [facility] Passive Range of Motion (PROM) Comments: [facility] General Strength Comments: 1/5 quad otherwise 0/5     Excell Seltzer, PT, DPT 04/10/2018, 4:27 PM

## 2018-04-10 NOTE — Progress Notes (Signed)
Physical Therapy Session Note  Patient Details  Name: Erik [name] MRN: [id number] Date of Birth: 01/07/1966  Today's Date: 04/10/2018 PT Individual Time: 1791-5056 PT Individual Time Calculation (min): 50 min   Short Term Goals: Week 4:  PT Short Term Goal 1 (Week 4): =LTG due to ELOS  Skilled Therapeutic Interventions/Progress Updates:   Pt in w/c and agreeable to therapy, no c/o pain. Family present for car transfer education. Pt self-propelled w/c to/from outside of hospital, including negotiating community environment, mod I. Practiced real car transfer w/ set-up assist only. Educated caregivers on safety concerns w/ car transfer including location of LEs, board placement, postural control, and w/c positioning. Pt able to perform all tasks w/o instruction from therapist, verbal cue when LLE was caught under w/c and set-up assist w/ slide board placement. Caregivers all verbalized understanding of education and feel comfortable assisting pt w/ car transfer. Returned to unit and worked on UE strength and endurance training on arm ergometer, level 1.0, 5 min forward and 5 min backward. Returned to room and ended session in supine and in care of NT, all needs met.   Therapy Documentation Precautions:  Precautions Precautions: Back Precaution Comments: TLSO worn when OOB Required Braces or Orthoses: Spinal Brace Spinal Brace: Thoracolumbosacral orthotic, Applied in sitting position Restrictions Weight Bearing Restrictions: No  Therapy/Group: Individual Therapy  Carman Auxier Clent Demark 04/10/2018, 10:46 AM

## 2018-04-10 NOTE — Progress Notes (Addendum)
[facility] PHYSICAL MEDICINE & REHABILITATION PROGRESS NOTE   Subjective/Complaints:   Pt states that he feels better after evacuating the remaining contrast. I asked him if he was having any sternal/xyphoid pain, and he admitted that he did but that it was mild. (he has had some musculoskeletal pain due to using UE/chest for transfers and w/c propulsion  ROS: Patient denies fever, rash, sore throat, blurred vision,  cough, shortness of breath or chest pain, joint or back pain, headache, or mood change.    Objective:   Dg Abd 1 View  Result Date: 04/08/2018 CLINICAL DATA:  Check for barium prior to CT.  Recent esophagram. EXAM: ABDOMEN - 1 VIEW COMPARISON:  CT topogram 04/07/2018 FINDINGS: There continues to be a large amount of barium throughout the colon including the appendix. No significant barium at the rectum. The colonic barium distribution is unchanged from the study 04/07/2018. Small amount of gas in the stomach. Surgical screws and rods in the thoracolumbar spine. IMPRESSION: Persistent barium in the colon. Barium distribution has not changed from the exam on 04/07/2018. Electronically Signed   By: Richarda Overlie M.D.   On: 04/08/2018 12:48   No results for input(s): WBC, HGB, HCT, PLT in the last 72 hours. No results for input(s): NA, K, CL, CO2, GLUCOSE, BUN, CREATININE, CALCIUM in the last 72 hours.  Intake/Output Summary (Last 24 hours) at 04/10/2018 0901 Last data filed at 04/10/2018 0218 Gross per 24 hour  Intake 120 ml  Output 650 ml  Net -530 ml     Physical Exam: Vital Signs Blood pressure (!) 157/106, pulse (!) 101, temperature 98.4 F (36.9 C), resp. rate 16, height 5\' 6"  (1.676 m), weight 70.2 kg, SpO2 98 %. Constitutional: No distress . Vital signs reviewed. Looks better HEENT: EOMI, oral membranes moist Neck: supple Cardiovascular: RRR without murmur. No JVD    Respiratory: CTA Bilaterally without wheezes or rales. Normal effort    GI: BS +, non-tender,  non-distended  Musc: No edema or tenderness in extremities. Mild tenderness with palpation of sternum/x Musculoskeletal: Lower extremity edema Neurological: He is alert and oriented  Motor: Bilateral upper extremities: 5/5 proximal to distal, no motor changes Bilateral lower extremities: 0/5 proximal distal, no motor changes Skin: Skin is warm and dry. He is not diaphoretic.  Back incision remains CDI Psychiatric: smiling and more like himself today  Assessment/Plan: 1. Functional deficits secondary to thoracic spinal cord injury which require 3+ hours per day of interdisciplinary therapy in a comprehensive inpatient rehab setting.  Physiatrist is providing close team supervision and 24 hour management of active medical problems listed below.  Physiatrist and rehab team continue to assess barriers to discharge/monitor patient progress toward functional and medical goals  Care Tool:  Bathing  Bathing activity did not occur: Refused Body parts bathed by patient: Right arm, Left arm, Chest, Abdomen, Front perineal area, Right upper leg, Left upper leg, Right lower leg, Left lower leg, Face, Buttocks   Body parts bathed by helper: Buttocks Body parts n/a: Front perineal area   Bathing assist Assist Level: Set up assist     Upper Body Dressing/Undressing Upper body dressing   What is the patient wearing?: Pull over shirt, Orthosis Orthosis activity level: Performed by patient  Upper body assist Assist Level: Set up assist    Lower Body Dressing/Undressing Lower body dressing      What is the patient wearing?: Pants, Incontinence brief, Orthosis     Lower body assist Assist for lower body  dressing: Set up assist     Toileting Toileting Toileting Activity did not occur (Clothing management and hygiene only): N/A (no void or bm)  Toileting assist Assist for toileting: Moderate Assistance - Patient 50 - 74%     Transfers Chair/bed transfer  Transfers assist      Chair/bed transfer assist level: Contact Guard/Touching assist     Locomotion Ambulation   Ambulation assist   Ambulation activity did not occur: Safety/medical concerns          Walk 10 feet activity   Assist  Walk 10 feet activity did not occur: Safety/medical concerns        Walk 50 feet activity   Assist Walk 50 feet with 2 turns activity did not occur: Safety/medical concerns         Walk 150 feet activity   Assist Walk 150 feet activity did not occur: Safety/medical concerns         Walk 10 feet on uneven surface  activity   Assist Walk 10 feet on uneven surfaces activity did not occur: Safety/medical concerns         Wheelchair     Assist Will patient use wheelchair at discharge?: Yes Type of Wheelchair: Manual    Wheelchair assist level: Independent Max wheelchair distance: 1000'    Wheelchair 50 feet with 2 turns activity    Assist        Assist Level: Independent   Wheelchair 150 feet activity     Assist     Assist Level: Independent     Medical Problem List and Plan: 1.  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  DVT Prophylaxis/Anticoagulation: Pharmaceutical: Lovenox bid x 3 months. (7 more weeks approximately)  -dopplers were negative  3. Pain Management: oxycodone prn.  4. Mood: continue to provide positive reinforcement  5. Neuropsych: This patient is capable of making decisions on his own behalf. 6. Skin/Wound Care: routine pressure relief measures.  7. Fluids/Electrolytes/Nutrition: poor po intake  -changed to lactose free diet  -simethicone for gas 8. ABLA: Hemoglobin 11.7 on 12/23 9. 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 Orthostatic symptoms?:   -continue  ACE/TEDs when up   abdominal binder if needed for hypotension, discussed use of abdominal binder when standing.   12.  Left knee effusion-fibular fx, noted on Xray  -folllow up xrays demonstrate early callus formation  -no bracing needed. Transfer/movement as tolerated 13. Nausea: ?viral enteritis  -improved over last two days  -feels better after evacuating contrast material  -had been moving bowels regularly with bowel program prior  -dc home on reglan, protonix   -some of this may be an esophageal dysmotility issue  -dc CT of abdomen  14. HTN: continued elevation. May be steroid induced but has been high before  -dc medrol   -send home on lisinopril 10mg  daily   Ranelle Oyster, MD, [facility] Physical Medicine & Rehabilitation    LOS: 31 days A FACE TO FACE EVALUATION WAS PERFORMED  Ranelle Oyster 04/10/2018, 9:01 AM

## 2018-04-10 NOTE — Progress Notes (Signed)
Social Work Patient ID: Erik [name], male   DOB: 29-Jan-1966, 53 y.o.   MRN: [id number]   Per MD, pt cleared for d/c home tomorrow.  Team aware.  Michaela Shankel, LCSW

## 2018-04-11 NOTE — Plan of Care (Signed)
  Problem: SCI BOWEL ELIMINATION Goal: RH STG MANAGE BOWEL WITH ASSISTANCE Description STG Manage Bowel with  Min Assistance.  Outcome: Completed/Met Goal: RH STG SCI MANAGE BOWEL WITH MEDICATION WITH ASSISTANCE Description STG SCI Manage bowel with medication with Min assistance.  Outcome: Completed/Met Goal: RH STG MANAGE BOWEL W/EQUIPMENT W/ASSISTANCE Description STG Manage Bowel With Equipment With Min Assistance  Outcome: Completed/Met Goal: RH STG SCI MANAGE BOWEL PROGRAM W/ASSIST OR AS APPROPRIATE Description STG SCI Manage bowel program w/min assist or as appropriate.  Outcome: Completed/Met   Problem: SCI BLADDER ELIMINATION Goal: RH STG MANAGE BLADDER WITH ASSISTANCE Description STG Manage Bladder With Min Assistance  Outcome: Completed/Met Goal: RH STG MANAGE BLADDER WITH EQUIPMENT WITH ASSISTANCE Description STG Manage Bladder With Equipment With min  Assistance  Outcome: Completed/Met Goal: RH STG SCI MANAGE BLADDER PROGRAM W/ASSISTANCE Description Pt will be Mod I with equipment and be able to manage bladder with I/O  cath   Outcome: Completed/Met   Problem: RH SKIN INTEGRITY Goal: RH STG SKIN FREE OF INFECTION/BREAKDOWN Description Pt will remain free from skin breakdown while in rehab with min assist  Outcome: Completed/Met Goal: RH STG MAINTAIN SKIN INTEGRITY WITH ASSISTANCE Description STG Maintain Skin Integrity With min Assistance.  Outcome: Completed/Met   Problem: RH SAFETY Goal: RH STG ADHERE TO SAFETY PRECAUTIONS W/ASSISTANCE/DEVICE Description STG Adhere to Safety Precautions With min Assistance/Device.  Outcome: Completed/Met   Problem: RH PAIN MANAGEMENT Goal: RH STG PAIN MANAGED AT OR BELOW PT'S PAIN GOAL Description Pain will be managed at a goal set of less than 3 out 10 with PRN pain med with mod I assist  Outcome: Completed/Met   Problem: RH KNOWLEDGE DEFICIT SCI Goal: RH STG INCREASE KNOWLEDGE OF SELF CARE AFTER  SCI Description Pt will be able to verbalize self care with Mod I assist  Outcome: Completed/Met

## 2018-04-11 NOTE — Discharge Instructions (Signed)
Inpatient Rehab Discharge Instructions  Erik [name] Discharge date and time:  04/11/18  Activities/Precautions/ Functional Status: Activity: no lifting, driving, or strenuous exercise  till cleared by MD Diet: regular diet Wound Care: keep wound clean and dry   Functional status:  ___ No restrictions     ___ Walk up steps independently _X__ 24/7 supervision/assistance   ___ Walk up steps with assistance ___ Intermittent supervision/assistance  ___ Bathe/dress independently ___ Walk with walker     _X__ Bathe/dress with assistance ___ Walk Independently    ___ Shower independently ___ Walk with assistance    ___ Shower with assistance _X__ No alcohol     ___ Return to work/school ________    COMMUNITY REFERRALS UPON DISCHARGE:    Home Health:   PT     OT                      Agency:  [facility]      Phone: [phone]   Medical Equipment/Items Ordered:  Wheelchair, hospital bed, transfer board                                                       Agency/Supplier:  [facility] @ [phone]  Catheter supplies:  [employer] @ [phone]  (will ship to the home)  Special Instructions: 1. Boost every 20-30 minutes when in chair.  2. Toilet yourself every 3-4 hors--especially after meals or drinking lots of fluids.   3. Wear brace before getting out of bed.    My questions have been answered and I understand these instructions. I will adhere to these goals and the provided educational materials after my discharge from the hospital.  Patient/Caregiver Signature _______________________________ Date __________  Clinician Signature _______________________________________ Date __________  Please bring this form and your medication list with you to all your follow-up doctor's appointments.

## 2018-04-11 NOTE — Progress Notes (Signed)
[facility] PHYSICAL MEDICINE & REHABILITATION PROGRESS NOTE   Subjective/Complaints:   Pt continues to feel well. Excited to go home. Eating/drinking. Denies belly pain  ROS: Patient denies fever, rash, sore throat, blurred vision, nausea, vomiting, diarrhea, cough, shortness of breath or chest pain, joint or back pain, headache, or mood change.   Objective:   No results found. No results for input(s): WBC, HGB, HCT, PLT in the last 72 hours. No results for input(s): NA, K, CL, CO2, GLUCOSE, BUN, CREATININE, CALCIUM in the last 72 hours.  Intake/Output Summary (Last 24 hours) at 04/11/2018 1017 Last data filed at 04/10/2018 1818 Gross per 24 hour  Intake 240 ml  Output 300 ml  Net -60 ml     Physical Exam: Vital Signs Blood pressure 110/83, pulse 94, temperature 97.8 F (36.6 C), temperature source Oral, resp. rate 12, height 5\' 6"  (1.676 m), weight 70.2 kg, SpO2 100 %. Constitutional: No distress . Vital signs reviewed. HEENT: EOMI, oral membranes moist Neck: supple Cardiovascular: RRR without murmur. No JVD    Respiratory: CTA Bilaterally without wheezes or rales. Normal effort    GI: BS +, non-tender, non-distended    Musc: No edema or tenderness in extremities. Mild tenderness with palpation of sternum/x Musculoskeletal: Lower extremity edema Neurological: He is alert and oriented  Motor: Bilateral upper extremities: 5/5 proximal to distal Bilateral lower extremities: 0/5 proximal distal, no neuro changes Skin: Skin is warm and dry. He is not diaphoretic.  Back incision remains CDI Psychiatric: pleasant  Assessment/Plan: 1. Functional deficits secondary to thoracic spinal cord injury which require 3+ hours per day of interdisciplinary therapy in a comprehensive inpatient rehab setting.  Physiatrist is providing close team supervision and 24 hour management of active medical problems listed below.  Physiatrist and rehab team continue to assess barriers to  discharge/monitor patient progress toward functional and medical goals  Care Tool:  Bathing  Bathing activity did not occur: Refused Body parts bathed by patient: Right arm, Left arm, Chest, Abdomen, Front perineal area, Right upper leg, Left upper leg, Right lower leg, Left lower leg, Face, Buttocks   Body parts bathed by helper: Buttocks Body parts n/a: Front perineal area   Bathing assist Assist Level: Independent     Upper Body Dressing/Undressing Upper body dressing   What is the patient wearing?: Pull over shirt, Orthosis Orthosis activity level: Performed by patient  Upper body assist Assist Level: Independent    Lower Body Dressing/Undressing Lower body dressing      What is the patient wearing?: Pants, Incontinence brief, Orthosis     Lower body assist Assist for lower body dressing: Independent     Toileting Toileting Toileting Activity did not occur (Clothing management and hygiene only): N/A (no void or bm)  Toileting assist Assist for toileting: Minimal Assistance - Patient > 75%     Transfers Chair/bed transfer  Transfers assist     Chair/bed transfer assist level: Set up assist     Locomotion Ambulation   Ambulation assist   Ambulation activity did not occur: N/A          Walk 10 feet activity   Assist  Walk 10 feet activity did not occur: N/A        Walk 50 feet activity   Assist Walk 50 feet with 2 turns activity did not occur: N/A         Walk 150 feet activity   Assist Walk 150 feet activity did not occur: N/A  Walk 10 feet on uneven surface  activity   Assist Walk 10 feet on uneven surfaces activity did not occur: N/A         Wheelchair     Assist Will patient use wheelchair at discharge?: Yes Type of Wheelchair: Manual    Wheelchair assist level: Independent Max wheelchair distance: 1000'    Wheelchair 50 feet with 2 turns activity    Assist        Assist Level: Independent    Wheelchair 150 feet activity     Assist     Assist Level: Independent     Medical Problem List and Plan: 1.  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  2.  DVT Prophylaxis/Anticoagulation: Pharmaceutical: Lovenox bid x 3 months. (7 more weeks approximately)  -dopplers were negative  3. Pain Management: oxycodone prn.  4. Mood: continue to provide positive reinforcement  5. Neuropsych: This patient is capable of making decisions on his own behalf. 6. Skin/Wound Care: routine pressure relief measures.  7. Fluids/Electrolytes/Nutrition: poor po intake  -changed to lactose free diet  -simethicone for gas 8. ABLA: Hemoglobin 11.7 on 12/23 9. Neurogenic bladder: I/O caths as needed     -recurrent E coli UTI  -off abx  -ditropan stopped    10 Neurogenic bowel:    -continue daily suppository for AM program  - fiber and probiotic     -resumed senna-s at HS  -had been on schedule until recent N/V began 11. Orthostatic symptoms?:   -continue  ACE/TEDs when up   abdominal binder if needed for hypotension, discussed use of abdominal binder when standing.   12.  Left knee effusion-fibular fx, noted on Xray  -folllow up xrays demonstrate early callus formation  -no bracing needed. Transfer/movement as tolerated 13. Nausea: ?viral enteritis  -improved over last two days  -feels better after evacuating contrast material  -had been moving bowels regularly with bowel program prior  -dc home on reglan for esophageal motility . Advised him that if he begins to have loose stool to hold the reglan  -protonix for? Esophagitis?      14. HTN: continued elevation. May be steroid induced but has been high before  -dc medrol   -send home on lisinopril 10mg  daily   Ranelle Oyster, MD, [facility]  Health Physical Medicine & Rehabilitation    LOS: 32 days A FACE TO FACE EVALUATION WAS PERFORMED  Ranelle Oyster 04/11/2018, 10:17 AM
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
## 2018-04-11 NOTE — Progress Notes (Signed)
Pt discharged with all belongings,pt is in his w/c with TLSO on, with daughter Daniel Nones.  Marissa Nestle, PA had given d/c instructions prior to d/c.  Pt denies c/o and states he is feeling good.

## 2018-04-11 NOTE — Progress Notes (Signed)
Social Work  Discharge Note  The overall goal for the admission was met for:   Discharge location: Yes - home with girlfriend  Length of Stay: No - extended briefly due to medical issues.  LOS = 32 days  Discharge activity level: Yes - modified independent w/c level  Home/community participation: Yes  Services provided included: MD, RD, PT, OT, RN, TR, Pharmacy and [place]: Private Insurance: [employer]  Follow-up services arranged: Home Health: RN, PT, OT via [place], DME: w/c, cushion, hospital bed, transfer board via [facility], Other: condom cath supplies via [employer] and Patient/Family has no preference for HH/DME agencies  Comments (or additional information):  Patient/Family verbalized understanding of follow-up arrangements: Yes  Individual responsible for coordination of the follow-up plan: pt  Confirmed correct DME delivered: Tacha Manni 04/11/2018    Sion Thane
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
## 2018-04-13 DIAGNOSIS — A084 Viral intestinal infection, unspecified: Secondary | ICD-10-CM

## 2018-04-13 DIAGNOSIS — K828 Other specified diseases of gallbladder: Secondary | ICD-10-CM

## 2018-04-13 DIAGNOSIS — S82402A Unspecified fracture of shaft of left fibula, initial encounter for closed fracture: Secondary | ICD-10-CM

## 2018-04-13 DIAGNOSIS — M25462 Effusion, left knee: Secondary | ICD-10-CM

## 2018-04-15 ENCOUNTER — Telehealth: Payer: Self-pay

## 2018-04-15 NOTE — Telephone Encounter (Signed)
While speaking with patients daughter during Transition Care Call she requesting SW to come out to discuss community resources, possibly a caregiver for mornings. Is this okay?

## 2018-04-15 NOTE — Telephone Encounter (Signed)
Transitional Care call-daughter Donell Beers    1. Are you/is patient experiencing any problems since coming home? No Are there any questions regarding any aspect of care? No 2. Are there any questions regarding medications administration/dosing? Lucienne Capers is not working. Advised pt to discuss at visit Are meds being taken as prescribed? Yes Patient should review meds with caller to confirm 3. Have there been any falls? No  4. Has Home Health been to the house and/or have they contacted you? Yes If not, have you tried to contact them? Can we help you contact them? 5. Are bowels and bladder emptying properly? Yes bladder, had to give stool softner for constipation Are there any unexpected incontinence issues? Yes If applicable, is patient following bowel/bladder programs? 6. Any fevers, problems with breathing, unexpected pain? No 7. Are there any skin problems or new areas of breakdown? No  8. Has the patient/family member arranged specialty MD follow up (ie cardiology/neurology/renal/surgical/etc)? Yes Can we help arrange? 9. Does the patient need any other services or support that we can help arrange? Requesting SW to discuss community services possible caregiver in the morning 10. Are caregivers following through as expected in assisting the patient? yes 11. Has the patient quit smoking, drinking alcohol, or using drugs as recommended? Yes expect smokes a cigarette in the mornings  Appointment time, arrive time 1:40 for 2:00 on 04/23/2018 with Dr. Riley Kill [address]

## 2018-04-15 NOTE — Telephone Encounter (Signed)
Yes if they have homecare services they can send a SW to eval.  Its doubtful they will get a caregiver and I am not sure why this was not addressed at discharge. We cannot order MSW eval without a skilled service in the home like PT/OT/ST/SN.

## 2018-04-16 ENCOUNTER — Telehealth: Payer: Self-pay

## 2018-04-16 NOTE — Telephone Encounter (Signed)
Jodie will put order in for SW eval

## 2018-04-16 NOTE — Telephone Encounter (Signed)
Jodie,RN from [facility] called requesting HHRN, PT, OT. HHRN short term and PT, OT 2wk2, 1wk1 will call if need more time.

## 2018-04-23 ENCOUNTER — Other Ambulatory Visit: Payer: Self-pay

## 2018-04-23 ENCOUNTER — Encounter
Payer: BLUE CROSS/BLUE SHIELD | Attending: Physical Medicine & Rehabilitation | Admitting: Physical Medicine & Rehabilitation

## 2018-04-23 ENCOUNTER — Encounter: Payer: Self-pay | Admitting: Physical Medicine & Rehabilitation

## 2018-04-23 VITALS — BP 118/79 | HR 88 | Ht 66.0 in | Wt 152.0 lb

## 2018-04-23 DIAGNOSIS — M4324 Fusion of spine, thoracic region: Secondary | ICD-10-CM

## 2018-04-23 DIAGNOSIS — G8220 Paraplegia, unspecified: Secondary | ICD-10-CM | POA: Diagnosis not present

## 2018-04-23 DIAGNOSIS — Z466 Encounter for fitting and adjustment of urinary device: Secondary | ICD-10-CM

## 2018-04-23 DIAGNOSIS — L89226 Pressure-induced deep tissue damage of left hip: Secondary | ICD-10-CM | POA: Diagnosis not present

## 2018-04-23 DIAGNOSIS — K592 Neurogenic bowel, not elsewhere classified: Secondary | ICD-10-CM

## 2018-04-23 DIAGNOSIS — N319 Neuromuscular dysfunction of bladder, unspecified: Secondary | ICD-10-CM

## 2018-04-23 DIAGNOSIS — S22089S Unspecified fracture of T11-T12 vertebra, sequela: Secondary | ICD-10-CM

## 2018-04-23 DIAGNOSIS — Z981 Arthrodesis status: Secondary | ICD-10-CM

## 2018-04-23 DIAGNOSIS — F1410 Cocaine abuse, uncomplicated: Secondary | ICD-10-CM

## 2018-04-23 DIAGNOSIS — S24104D Unspecified injury at T11-T12 level of thoracic spinal cord, subsequent encounter: Secondary | ICD-10-CM

## 2018-04-23 DIAGNOSIS — S24104S Unspecified injury at T11-T12 level of thoracic spinal cord, sequela: Secondary | ICD-10-CM

## 2018-04-23 DIAGNOSIS — S22089D Unspecified fracture of T11-T12 vertebra, subsequent encounter for fracture with routine healing: Secondary | ICD-10-CM

## 2018-04-23 DIAGNOSIS — S233XXD Sprain of ligaments of thoracic spine, subsequent encounter: Secondary | ICD-10-CM

## 2018-04-23 DIAGNOSIS — S82401D Unspecified fracture of shaft of right fibula, subsequent encounter for closed fracture with routine healing: Secondary | ICD-10-CM

## 2018-04-23 DIAGNOSIS — M25462 Effusion, left knee: Secondary | ICD-10-CM

## 2018-04-23 DIAGNOSIS — L89209 Pressure ulcer of unspecified hip, unspecified stage: Secondary | ICD-10-CM | POA: Insufficient documentation

## 2018-04-23 DIAGNOSIS — G8221 Paraplegia, complete: Secondary | ICD-10-CM

## 2018-04-23 DIAGNOSIS — S2241XD Multiple fractures of ribs, right side, subsequent encounter for fracture with routine healing: Secondary | ICD-10-CM

## 2018-04-23 MED ORDER — BACLOFEN 10 MG PO TABS: 10.0000 mg | ORAL_TABLET | Freq: Three times a day (TID) | ORAL | 3 refills | Status: DC

## 2018-04-23 NOTE — Patient Instructions (Addendum)
1. ESTABLISH A BOWEL EACH MORNING  -DIGITAL STIM WITH GLOVE OR TOILET PAPER  -REGULAR PROBIOTIC  -WORK TO HAVE "SNAKE-LIKE" STOOLS   2. CONTACT ADVANCED. BUT YOU MAY NEED TO TALK WITH VENDOR OF THE CONDOM CATHS TO FIND OUT THE TYPE WHICH WILL WORK BEST FOR YOU.  3. TAKE BACLOFEN AS NEEDED.
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
## 2018-04-25 ENCOUNTER — Encounter: Payer: Self-pay | Admitting: Physical Medicine & Rehabilitation

## 2018-05-02 ENCOUNTER — Telehealth: Payer: Self-pay | Admitting: *Deleted

## 2018-05-02 NOTE — Telephone Encounter (Signed)
Mr Kerner daughter Donell Beers called and needs to speak with Dr Riley Kill.  She has questions about something he was given in the hospital and he is having the same issues.  She would like to know if it can be ordered again.  She is requesting to speak with Dr Riley Kill.  Her number is [phone].

## 2018-05-05 MED ORDER — METOCLOPRAMIDE HCL 10 MG PO TABS: 10.0000 mg | ORAL_TABLET | Freq: Three times a day (TID) | ORAL | 1 refills | Status: DC

## 2018-05-05 NOTE — Telephone Encounter (Signed)
Called in reglan which worked in hospital. Apparently he has GI appt tomorrow. Spoke with his daughter.

## 2018-05-05 NOTE — Telephone Encounter (Signed)
Daughter called again stating pt is having issues with his stomach, not able to hold anything down. Given something in the hospital for that.

## 2018-05-12 ENCOUNTER — Other Ambulatory Visit (HOSPITAL_COMMUNITY): Payer: Self-pay | Admitting: Physical Medicine and Rehabilitation

## 2018-05-12 ENCOUNTER — Telehealth: Payer: Self-pay | Admitting: *Deleted

## 2018-05-12 DIAGNOSIS — K592 Neurogenic bowel, not elsewhere classified: Secondary | ICD-10-CM

## 2018-05-12 DIAGNOSIS — S22089S Unspecified fracture of T11-T12 vertebra, sequela: Secondary | ICD-10-CM

## 2018-05-12 DIAGNOSIS — G8220 Paraplegia, unspecified: Secondary | ICD-10-CM

## 2018-05-12 DIAGNOSIS — S24104S Unspecified injury at T11-T12 level of thoracic spinal cord, sequela: Secondary | ICD-10-CM

## 2018-05-12 DIAGNOSIS — L89226 Pressure-induced deep tissue damage of left hip: Secondary | ICD-10-CM

## 2018-05-12 MED ORDER — LISINOPRIL 10 MG PO TABS: 10.0000 mg | ORAL_TABLET | Freq: Every day | ORAL | 0 refills | Status: DC

## 2018-05-12 MED ORDER — LISINOPRIL 10 MG PO TABS: 10.0000 mg | ORAL_TABLET | Freq: Every day | ORAL | 0 refills | Status: AC

## 2018-05-12 NOTE — Telephone Encounter (Signed)
Mr Blaise has called and his cramps in his legs have gotten worse.  The baclofen is not helping. Please advise

## 2018-05-13 MED ORDER — BACLOFEN 20 MG PO TABS: 20.0000 mg | ORAL_TABLET | Freq: Three times a day (TID) | ORAL | 3 refills | Status: DC

## 2018-05-13 NOTE — Telephone Encounter (Signed)
Attempted to reach Erik [name].  Left message for him to call back for directions on how to increase his baclofen.

## 2018-05-13 NOTE — Telephone Encounter (Signed)
If he's tolerating the baclofen, increase dose as follows Currently baclofen 10mg  tid  Days 1-4: 10-10-20mg  Days 5-8: 20-10-20mg  Days 9+: 20mg  TID  New rx sent to pharmacy

## 2018-05-13 NOTE — Telephone Encounter (Signed)
Directions given

## 2018-05-16 ENCOUNTER — Telehealth: Payer: Self-pay | Admitting: *Deleted

## 2018-05-16 NOTE — Telephone Encounter (Signed)
Erik [name] is asking for a refill on his Tramadol, and there was a refill request forwarded for his Lovenox.  Please advise

## 2018-05-17 MED ORDER — TRAMADOL HCL 50 MG PO TABS: 50.0000 mg | ORAL_TABLET | Freq: Four times a day (QID) | ORAL | 1 refills | Status: AC | PRN

## 2018-05-17 MED ORDER — ENOXAPARIN SODIUM 30 MG/0.3ML ~~LOC~~ SOLN: 30.0000 mg | Freq: Two times a day (BID) | SUBCUTANEOUS | 1 refills | Status: DC

## 2018-05-17 NOTE — Telephone Encounter (Signed)
meds refilled 

## 2018-05-19 NOTE — Telephone Encounter (Signed)
Notified. 

## 2018-05-28 ENCOUNTER — Telehealth: Payer: Self-pay | Admitting: *Deleted

## 2018-05-28 NOTE — Telephone Encounter (Signed)
Misty Stanley, RN, [facility] left a message reporting patient has stopped his Lovenox prematurely. She understands this is for DVT prophylaxis. Had he stayed on the 3 month schedule he would be stopping in about 5 days.  According to last clkinic note his Dopplers were negative. Also, she reports patient's wounds on his gluteasl have merged, most likely due to paralysis and failure to change positions.  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  He is going to wound center today. [facility] fears this wound will require surgical debridement.  FYI

## 2018-06-04 ENCOUNTER — Encounter: Payer: Self-pay | Admitting: Physical Medicine & Rehabilitation

## 2018-06-04 ENCOUNTER — Other Ambulatory Visit: Payer: Self-pay

## 2018-06-04 ENCOUNTER — Encounter
Payer: BLUE CROSS/BLUE SHIELD | Attending: Physical Medicine & Rehabilitation | Admitting: Physical Medicine & Rehabilitation

## 2018-06-04 VITALS — BP 110/80 | HR 102 | Ht 66.0 in | Wt 150.0 lb

## 2018-06-04 DIAGNOSIS — S24104S Unspecified injury at T11-T12 level of thoracic spinal cord, sequela: Secondary | ICD-10-CM | POA: Diagnosis not present

## 2018-06-04 DIAGNOSIS — G904 Autonomic dysreflexia: Secondary | ICD-10-CM | POA: Diagnosis not present

## 2018-06-04 DIAGNOSIS — G8220 Paraplegia, unspecified: Secondary | ICD-10-CM | POA: Diagnosis not present

## 2018-06-04 DIAGNOSIS — L89226 Pressure-induced deep tissue damage of left hip: Secondary | ICD-10-CM | POA: Diagnosis present

## 2018-06-04 DIAGNOSIS — S22089S Unspecified fracture of T11-T12 vertebra, sequela: Secondary | ICD-10-CM

## 2018-06-04 DIAGNOSIS — L89203 Pressure ulcer of unspecified hip, stage 3: Secondary | ICD-10-CM | POA: Diagnosis not present

## 2018-06-04 DIAGNOSIS — K592 Neurogenic bowel, not elsewhere classified: Secondary | ICD-10-CM | POA: Diagnosis present

## 2018-06-04 MED ORDER — TIZANIDINE HCL 2 MG PO TABS: 1.0000 mg | ORAL_TABLET | Freq: Three times a day (TID) | ORAL | 2 refills | Status: DC
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
## 2018-06-04 NOTE — Patient Instructions (Addendum)
PLEASE FEEL FREE TO CALL OUR OFFICE WITH ANY PROBLEMS OR QUESTIONS [phone])   TAKE 1/2 TABLET OF TIZANIDINE THREE X DAILY FOR SPASMS. YOU MAY INCREASE TO FULL TABLET IF NEEDED AND IF YOU'RE NOT HAVING ANY PROBLEMS TOLERATING THE MEDICINE.

## 2018-06-09 ENCOUNTER — Telehealth: Payer: Self-pay

## 2018-06-09 NOTE — Telephone Encounter (Signed)
Patient called stating that Tizanidine is making him cough and he wants something else for muscle spasms.

## 2018-06-10 ENCOUNTER — Telehealth: Payer: Self-pay | Admitting: *Deleted

## 2018-06-10 ENCOUNTER — Other Ambulatory Visit (HOSPITAL_COMMUNITY): Payer: Self-pay | Admitting: Physical Medicine & Rehabilitation

## 2018-06-10 MED ORDER — DANTROLENE SODIUM 25 MG PO CAPS: 25.0000 mg | ORAL_CAPSULE | Freq: Two times a day (BID) | ORAL | 4 refills | Status: DC

## 2018-06-10 NOTE — Telephone Encounter (Signed)
Patient notified. Patient verbalized understanding.

## 2018-06-10 NOTE — Telephone Encounter (Signed)
dc'ed tizaindine and began dantrium 25mg  bid, rx sent

## 2018-06-10 NOTE — Telephone Encounter (Signed)
Recieved electronic medication refill for Protonix, noticed in his chart multiple stomach medications with patient having adverse reactions to them possibly, also noted multiple types of stomach medications as well.  Unsure if ok to refill this medication with listed information.  Please advise.

## 2018-06-10 NOTE — Telephone Encounter (Signed)
Erik Mention daughter called to let us know that [facility] will be faxing over a request for [facility] to assist Erik [name].  FYI.

## 2018-06-11 ENCOUNTER — Telehealth: Payer: Self-pay | Admitting: Physical Medicine & Rehabilitation

## 2018-06-11 NOTE — Telephone Encounter (Signed)
Letter written and in my outbox.

## 2018-06-11 NOTE — Telephone Encounter (Signed)
protonix RF'd

## 2018-06-11 NOTE — Telephone Encounter (Signed)
Phineas Inches daughter Graciano Hogland came into office requesting a letter for school stating she is the primary caregiver for her dad. She has missed a lot of time at school due to taking care of Mr. Stitt. Please include beginning date of care.

## 2018-06-12 NOTE — Telephone Encounter (Signed)
Erik [name] has been notified.

## 2018-06-16 ENCOUNTER — Telehealth: Payer: Self-pay

## 2018-06-16 NOTE — Telephone Encounter (Signed)
Patient called yesterday and today stating he has pain in his leg and medication is not helping would like something different.

## 2018-06-17 NOTE — Telephone Encounter (Signed)
I assume these are spasms???  If so, increase dantrium to 50mg  twice daily

## 2018-06-18 NOTE — Telephone Encounter (Signed)
Left a message  For patient to double his dantrium from 25 mg BID to 50 mg BID.  I educated to give it some time and call us with results

## 2018-07-16 ENCOUNTER — Telehealth: Payer: Self-pay | Admitting: *Deleted

## 2018-07-16 MED ORDER — TRAZODONE HCL 50 MG PO TABS: 50.0000 mg | ORAL_TABLET | Freq: Every day | ORAL | 2 refills | Status: DC

## 2018-07-16 NOTE — Telephone Encounter (Signed)
Sent in trazodone 50mg  qhs

## 2018-07-16 NOTE — Telephone Encounter (Signed)
Erik [name] is asking for something to sleep. He has not been sleeping well lately.

## 2018-07-17 NOTE — Telephone Encounter (Signed)
Patient has been notified

## 2018-07-22 ENCOUNTER — Other Ambulatory Visit: Payer: Self-pay | Admitting: Physical Medicine & Rehabilitation

## 2018-07-23 NOTE — Telephone Encounter (Signed)
Med refilled.

## 2018-07-24 ENCOUNTER — Telehealth: Payer: Self-pay

## 2018-07-24 NOTE — Telephone Encounter (Signed)
Pt called stating he is having bad spasms and leg cramps. He would like medication called in for him. States what he been taking is not working.

## 2018-07-25 MED ORDER — DANTROLENE SODIUM 50 MG PO CAPS: 50.0000 mg | ORAL_CAPSULE | Freq: Three times a day (TID) | ORAL | 3 refills | Status: DC

## 2018-07-25 NOTE — Telephone Encounter (Signed)
I increased dantrium to 50mg  tid. rx sent to pharmacy.

## 2018-07-25 NOTE — Telephone Encounter (Signed)
Notified. 

## 2018-07-28 ENCOUNTER — Telehealth: Payer: Self-pay | Admitting: *Deleted

## 2018-07-28 NOTE — Telephone Encounter (Signed)
Erik [name] reports that medicaid is requiring the MD tell them why he needs the dantrium (?PA) before he can get the medication.

## 2018-07-29 NOTE — Telephone Encounter (Signed)
Prior Authorization submitted for dantrolene 50 mg capsules TID to  [facility]. Patient notified

## 2018-08-11 ENCOUNTER — Telehealth: Payer: Self-pay

## 2018-08-11 NOTE — Telephone Encounter (Signed)
Pt called asking if medication approved and it has approved, pt notified. He states he has been fainting spells 3 times for the last 3 weeks.  Not doing anything to cause it. Paramedic called and blood sugar was normal and BP normal. Please advise

## 2018-09-09 ENCOUNTER — Telehealth: Payer: Self-pay | Admitting: *Deleted

## 2018-09-09 NOTE — Telephone Encounter (Signed)
Erik [name] is calling to ask Dr Naaman Plummer if he can have oxycodone or hydrocodone for pain.  He is hurting and the other is not working. He also wants to know if he can use the suppository QOD instead of QD. Please advise.

## 2018-09-09 NOTE — Telephone Encounter (Signed)
He may use supp qod if he's having regular BM's qod. To increase pain medicine, he'll need to see me or eunice in person.

## 2018-09-10 ENCOUNTER — Other Ambulatory Visit: Payer: Self-pay | Admitting: Physical Medicine & Rehabilitation

## 2018-09-10 DIAGNOSIS — S22089S Unspecified fracture of T11-T12 vertebra, sequela: Secondary | ICD-10-CM

## 2018-09-10 DIAGNOSIS — G8220 Paraplegia, unspecified: Secondary | ICD-10-CM

## 2018-09-10 DIAGNOSIS — S24104S Unspecified injury at T11-T12 level of thoracic spinal cord, sequela: Secondary | ICD-10-CM

## 2018-09-10 DIAGNOSIS — L89226 Pressure-induced deep tissue damage of left hip: Secondary | ICD-10-CM

## 2018-09-10 DIAGNOSIS — K592 Neurogenic bowel, not elsewhere classified: Secondary | ICD-10-CM

## 2018-09-10 NOTE — Telephone Encounter (Signed)
I notified Erik [name].  He says he has a doctor there so he did not want to schedule an appointment. I told him to call us if he changed his mind.

## 2018-09-16 ENCOUNTER — Other Ambulatory Visit: Payer: Self-pay | Admitting: Physical Medicine & Rehabilitation

## 2018-09-16 DIAGNOSIS — G8220 Paraplegia, unspecified: Secondary | ICD-10-CM

## 2018-09-16 DIAGNOSIS — S22089S Unspecified fracture of T11-T12 vertebra, sequela: Secondary | ICD-10-CM

## 2018-09-16 DIAGNOSIS — S24104S Unspecified injury at T11-T12 level of thoracic spinal cord, sequela: Secondary | ICD-10-CM

## 2018-09-16 DIAGNOSIS — K592 Neurogenic bowel, not elsewhere classified: Secondary | ICD-10-CM

## 2018-09-16 DIAGNOSIS — L89226 Pressure-induced deep tissue damage of left hip: Secondary | ICD-10-CM

## 2018-10-08 ENCOUNTER — Telehealth: Payer: Self-pay

## 2018-10-08 NOTE — Telephone Encounter (Signed)
Patient called stating that Trazodone 50 mg. Need another sleep aid.  Patient is aware that Dr. Naaman Plummer is out of the office until next week.

## 2018-10-13 MED ORDER — TRAZODONE HCL 100 MG PO TABS: 100.0000 mg | ORAL_TABLET | Freq: Every day | ORAL | 3 refills | Status: DC

## 2018-10-13 NOTE — Telephone Encounter (Signed)
Sent new rx for 100mg  trazodone to the pharmacy. He may double up on what he has left. Needs to make sure he's not napping during the day and has a reasonable sleeping environment as well

## 2018-10-13 NOTE — Telephone Encounter (Signed)
I notified Erik [name] with the instructions.  He also says that he needs something different for his spasms.  What he is taking is not helping.

## 2018-10-15 NOTE — Telephone Encounter (Signed)
Patient is calling back about needing something different for his spasms.  Please call patient.

## 2018-10-16 MED ORDER — DANTROLENE SODIUM 50 MG PO CAPS: 50.0000 mg | ORAL_CAPSULE | Freq: Four times a day (QID) | ORAL | 3 refills | Status: AC

## 2018-10-16 NOTE — Telephone Encounter (Signed)
I have spoken with Mr Erik [name] and informed him of the change.  He has an appt with Dr Naaman Plummer on 12/03/18. If this increase does not work, then he will need to schedule another appt with [name] NP before that time.

## 2018-10-16 NOTE — Telephone Encounter (Signed)
I increased his dantrium to 50mg  QID. There will be no further changes without an examination by MD or NP. He hasn't been to office since March

## 2018-11-02 ENCOUNTER — Other Ambulatory Visit: Payer: Self-pay | Admitting: Physical Medicine & Rehabilitation

## 2018-11-05 ENCOUNTER — Telehealth: Payer: Self-pay | Admitting: *Deleted

## 2018-11-05 NOTE — Telephone Encounter (Signed)
Mr Samons daughter called and Algis Liming did her FMLA papers for her caretaking of her father previously, and they need renewal. She is asking what she should do. I have advised her to drop them by the office and they will have to be done and Dr Naaman Plummer to sign.

## 2018-12-03 ENCOUNTER — Encounter: Payer: Medicaid Other | Attending: Physical Medicine & Rehabilitation | Admitting: Physical Medicine & Rehabilitation

## 2018-12-23 ENCOUNTER — Other Ambulatory Visit: Payer: Self-pay | Admitting: Physical Medicine & Rehabilitation

## 2018-12-30 ENCOUNTER — Other Ambulatory Visit: Payer: Self-pay | Admitting: Physical Medicine & Rehabilitation

## 2018-12-31 ENCOUNTER — Other Ambulatory Visit: Payer: Self-pay | Admitting: Physical Medicine & Rehabilitation

## 2019-02-14 ENCOUNTER — Other Ambulatory Visit: Payer: Self-pay | Admitting: Physical Medicine & Rehabilitation

## 2019-02-23 ENCOUNTER — Other Ambulatory Visit: Payer: Self-pay | Admitting: Physical Medicine & Rehabilitation

## 2019-03-18 ENCOUNTER — Other Ambulatory Visit: Payer: Self-pay | Admitting: Physical Medicine & Rehabilitation

## 2019-03-21 ENCOUNTER — Other Ambulatory Visit: Payer: Self-pay | Admitting: Physical Medicine & Rehabilitation

## 2019-07-03 ENCOUNTER — Encounter (HOSPITAL_COMMUNITY): Payer: Self-pay | Admitting: Emergency Medicine

## 2019-07-03 ENCOUNTER — Other Ambulatory Visit: Payer: Self-pay

## 2019-07-03 ENCOUNTER — Emergency Department (HOSPITAL_COMMUNITY)
Admission: EM | Admit: 2019-07-03 | Discharge: 2019-07-04 | Disposition: A | Discharge status: Home / Self Care | Payer: Medicaid Other | Attending: Emergency Medicine | Admitting: Emergency Medicine

## 2019-07-03 DIAGNOSIS — R1013 Epigastric pain: Secondary | ICD-10-CM | POA: Insufficient documentation

## 2019-07-03 DIAGNOSIS — F17210 Nicotine dependence, cigarettes, uncomplicated: Secondary | ICD-10-CM | POA: Insufficient documentation

## 2019-07-03 DIAGNOSIS — Z79899 Other long term (current) drug therapy: Secondary | ICD-10-CM | POA: Diagnosis not present

## 2019-07-03 DIAGNOSIS — R112 Nausea with vomiting, unspecified: Secondary | ICD-10-CM

## 2019-07-03 LAB — COMPREHENSIVE METABOLIC PANEL
ALT: 8 U/L (ref 0–44)
AST: 14 U/L — ABNORMAL LOW (ref 15–41)
Albumin: 3.3 g/dL — ABNORMAL LOW (ref 3.5–5.0)
Alkaline Phosphatase: 71 U/L (ref 38–126)
Anion gap: 12 (ref 5–15)
BUN: <5 mg/dL — ABNORMAL LOW (ref 6–20)
CO2: 24 mmol/L (ref 22–32)
Calcium: 9.3 mg/dL (ref 8.9–10.3)
Chloride: 104 mmol/L (ref 98–111)
Creatinine, Ser: 0.58 mg/dL — ABNORMAL LOW (ref 0.61–1.24)
GFR calc Af Amer: >60 mL/min (ref >60)
GFR calc non Af Amer: >60 mL/min (ref >60)
Glucose, Bld: 109 mg/dL — ABNORMAL HIGH (ref 70–99)
Potassium: 3.4 mmol/L — ABNORMAL LOW (ref 3.5–5.1)
Sodium: 140 mmol/L (ref 135–145)
Total Bilirubin: 0.3 mg/dL (ref 0.3–1.2)
Total Protein: 6.5 g/dL (ref 6.5–8.1)

## 2019-07-03 LAB — CBC
HCT: 38.7 % — ABNORMAL LOW (ref 39.0–52.0)
Hemoglobin: 12.3 g/dL — ABNORMAL LOW (ref 13.0–17.0)
MCH: 26.9 pg (ref 26.0–34.0)
MCHC: 31.8 g/dL (ref 30.0–36.0)
MCV: 84.7 fL (ref 80.0–100.0)
Platelets: 427 10*3/uL — ABNORMAL HIGH (ref 150–400)
RBC: 4.57 MIL/uL (ref 4.22–5.81)
RDW: 18.6 % — ABNORMAL HIGH (ref 11.5–15.5)
WBC: 7.6 10*3/uL (ref 4.0–10.5)
nRBC: 0 % (ref 0.0–0.2)

## 2019-07-03 LAB — LIPASE, BLOOD: Lipase: 19 U/L (ref 11–51)

## 2019-07-03 MED ORDER — ENOXAPARIN SODIUM 80 MG/0.8ML ~~LOC~~ SOLN
1.00 | SUBCUTANEOUS | Status: DC
Start: 2019-07-02 — End: 2019-07-03

## 2019-07-03 MED ORDER — METOCLOPRAMIDE HCL 10 MG PO TABS
10.00 | ORAL_TABLET | ORAL | Status: DC
Start: 2019-07-02 — End: 2019-07-03

## 2019-07-03 MED ORDER — SODIUM CHLORIDE 0.9 % IV BOLUS
1000.0000 mL | Freq: Once | INTRAVENOUS | Status: AC
  Administered 2019-07-04: 1000 mL via INTRAVENOUS

## 2019-07-03 MED ORDER — ONDANSETRON 4 MG PO TBDP
4.0000 mg | ORAL_TABLET | Freq: Once | ORAL | Status: AC | PRN
  Administered 2019-07-03: 23:00:00 4 mg via ORAL
  Filled 2019-07-03: qty 1

## 2019-07-03 MED ORDER — GENERIC EXTERNAL MEDICATION
Status: DC
Start: ? — End: 2019-07-03

## 2019-07-03 MED ORDER — POTASSIUM CHLORIDE IN NACL 20-0.9 MEQ/L-% IV SOLN
100.00 | INTRAVENOUS | Status: DC
Start: ? — End: 2019-07-03

## 2019-07-03 MED ORDER — CIPROFLOXACIN IN D5W 400 MG/200ML IV SOLN
400.00 | INTRAVENOUS | Status: DC
Start: 2019-07-02 — End: 2019-07-03

## 2019-07-03 MED ORDER — POLYETHYLENE GLYCOL 3350 17 GM/SCOOP PO POWD
17.00 | ORAL | Status: DC
Start: ? — End: 2019-07-03

## 2019-07-03 MED ORDER — SODIUM CHLORIDE 0.9% FLUSH
3.0000 mL | Freq: Once | INTRAVENOUS | Status: AC
  Administered 2019-07-04: 3 mL via INTRAVENOUS

## 2019-07-03 MED ORDER — TRAZODONE HCL 100 MG PO TABS
100.00 | ORAL_TABLET | ORAL | Status: DC
Start: 2019-07-02 — End: 2019-07-03

## 2019-07-03 MED ORDER — ALUM & MAG HYDROXIDE-SIMETH 200-200-20 MG/5ML PO SUSP
30.0000 mL | Freq: Once | ORAL | Status: AC
  Administered 2019-07-03: 30 mL via ORAL
  Filled 2019-07-03: qty 30

## 2019-07-03 MED ORDER — GUAIFENESIN 100 MG/5ML PO SYRP
200.00 | ORAL_SOLUTION | ORAL | Status: DC
Start: ? — End: 2019-07-03

## 2019-07-03 MED ORDER — PANTOPRAZOLE SODIUM 40 MG IV SOLR
40.00 | INTRAVENOUS | Status: DC
Start: 2019-07-03 — End: 2019-07-03

## 2019-07-03 MED ORDER — LIDOCAINE VISCOUS HCL 2 % MT SOLN
15.0000 mL | Freq: Once | OROMUCOSAL | Status: AC
  Administered 2019-07-03: 15 mL via ORAL
  Filled 2019-07-03: qty 15

## 2019-07-03 MED ORDER — PREGABALIN 150 MG PO CAPS
150.00 | ORAL_CAPSULE | ORAL | Status: DC
Start: 2019-07-02 — End: 2019-07-03

## 2019-07-03 MED ORDER — PANTOPRAZOLE SODIUM 40 MG IV SOLR
40.0000 mg | Freq: Once | INTRAVENOUS | Status: AC
  Administered 2019-07-04: 40 mg via INTRAVENOUS
  Filled 2019-07-03: qty 40

## 2019-07-03 MED ORDER — ASPIRIN 325 MG PO TBEC
325.00 | DELAYED_RELEASE_TABLET | ORAL | Status: DC
Start: 2019-07-03 — End: 2019-07-03

## 2019-07-03 MED ORDER — TRAMADOL HCL 50 MG PO TABS
50.00 | ORAL_TABLET | ORAL | Status: DC
Start: ? — End: 2019-07-03

## 2019-07-03 MED ORDER — SODIUM CHLORIDE 0.9 % IV SOLN
10.00 | INTRAVENOUS | Status: DC
Start: ? — End: 2019-07-03

## 2019-07-03 NOTE — ED Notes (Signed)
ED Provider at bedside. 
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
## 2019-07-03 NOTE — ED Triage Notes (Addendum)
  Patient comes in with abdominal pain and emesis that has been going on since last Friday.  Patient was admitted at [facility] and discharged 3/31.  Patient states he is unable to keep anything down.  Last emesis episode about 20 mins ago, greenish yellow.  Discharged with condom catheter.  Pain 8/10  Patient has spinal cord injury from [facility] and is paraplegic.

## 2019-07-04 LAB — URINALYSIS, ROUTINE W REFLEX MICROSCOPIC
Bacteria, UA: NONE SEEN
Bilirubin Urine: NEGATIVE
Glucose, UA: NEGATIVE mg/dL
Ketones, ur: NEGATIVE mg/dL
Nitrite: NEGATIVE
Protein, ur: NEGATIVE mg/dL
Specific Gravity, Urine: 1.009 (ref 1.005–1.030)
pH: 8 (ref 5.0–8.0)

## 2019-07-04 MED ORDER — PANTOPRAZOLE SODIUM 20 MG PO TBEC: 20.0000 mg | DELAYED_RELEASE_TABLET | Freq: Every day | ORAL | 0 refills | Status: AC

## 2019-07-04 NOTE — ED Notes (Signed)
Pt verbalizes understanding of d/c instructions. Prescriptions reviewed with patient. Pt taken to lobby via personal wheelchair at d/c with all belongings and with family.

## 2019-07-04 NOTE — Discharge Instructions (Addendum)
Begin taking Protonix as prescribed.  Follow-up with gastroenterology if your symptoms persist.  The contact information for [facility] has been provided in this discharge summary for you to call and make these arrangements.  Return to the emergency department if symptoms significantly worsen or change.

## 2019-07-04 NOTE — ED Notes (Signed)
Urine culture tube sent down with urine sample.   

## 2019-07-04 NOTE — ED Notes (Signed)
Pt given gingerale and crackers for PO challenge. Pt tolerating well. Will continue to monitor.   

## 2019-08-14 ENCOUNTER — Other Ambulatory Visit: Payer: Self-pay | Admitting: Physical Medicine & Rehabilitation

## 2019-09-14 ENCOUNTER — Other Ambulatory Visit: Payer: Self-pay | Admitting: Physical Medicine & Rehabilitation

## 2020-07-29 IMAGING — DX DG KNEE 1-2V*L*
1 series · 2 of 2 positions shown · non-contrast
Comparison: None.

CLINICAL DATA: 52-year-old male with knee effusion. Initial
encounter.

EXAM:
LEFT KNEE - 1-2 VIEW

[Series 1: knee · 0.14mm/px · 2 of 2 slices shown]
[im 1/2]
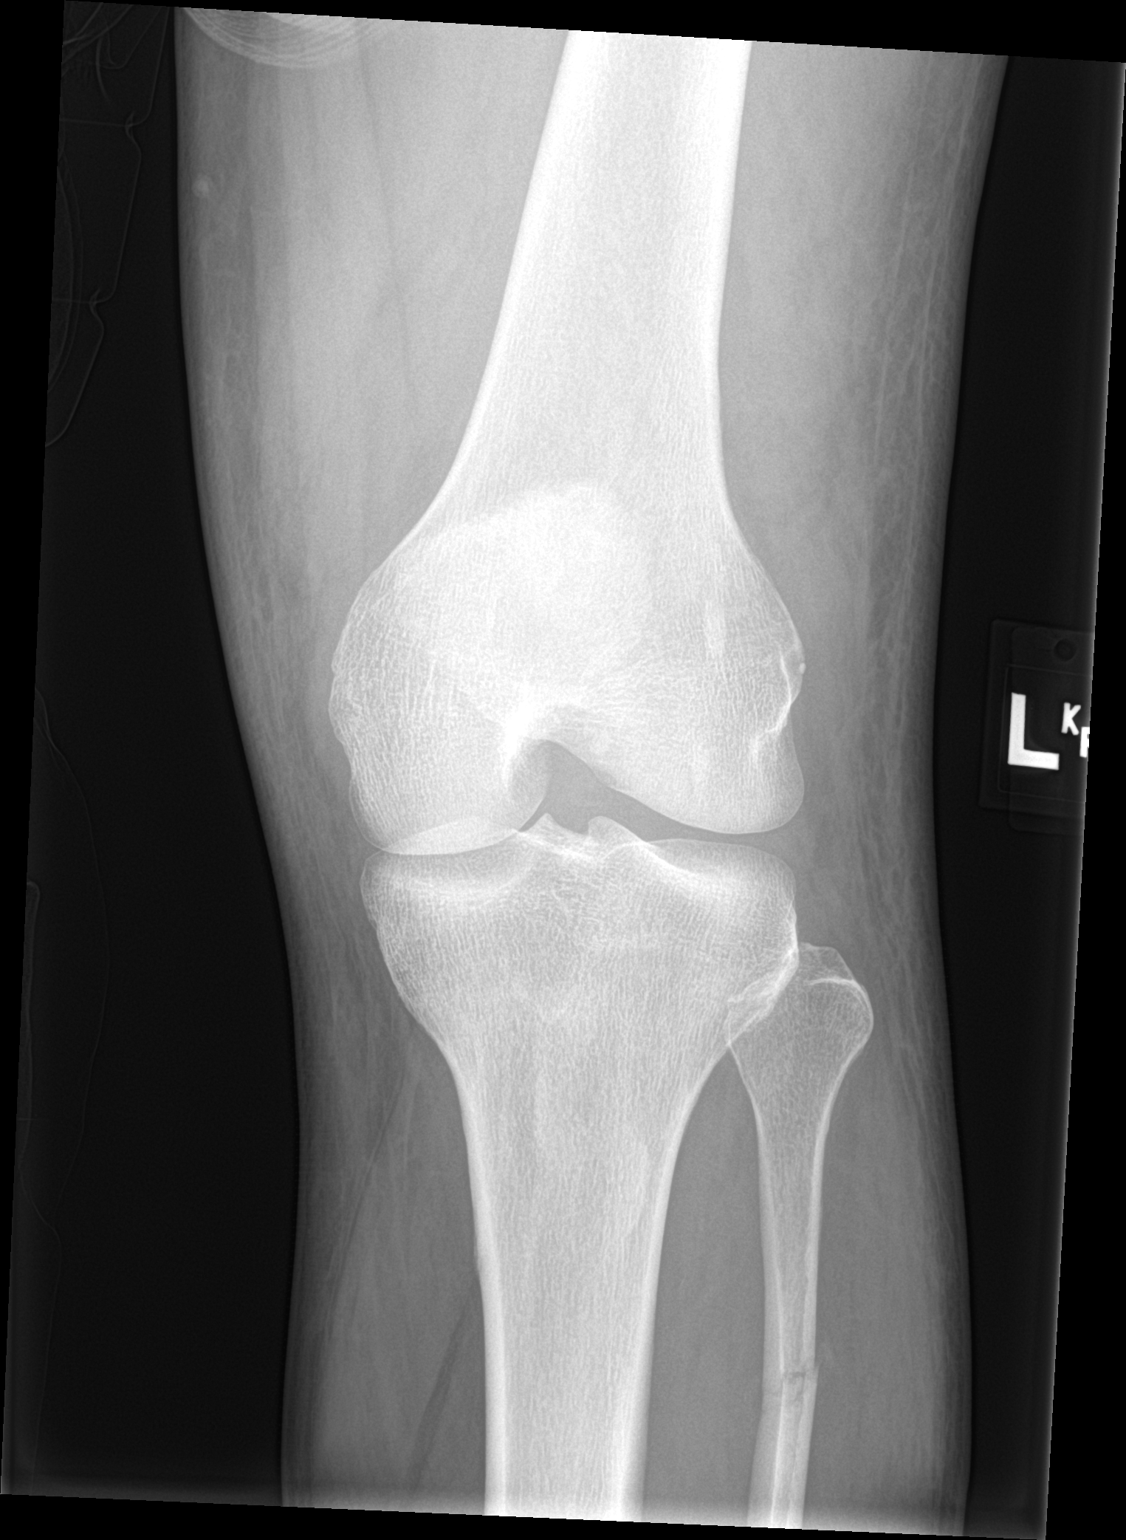
[im 2/2]
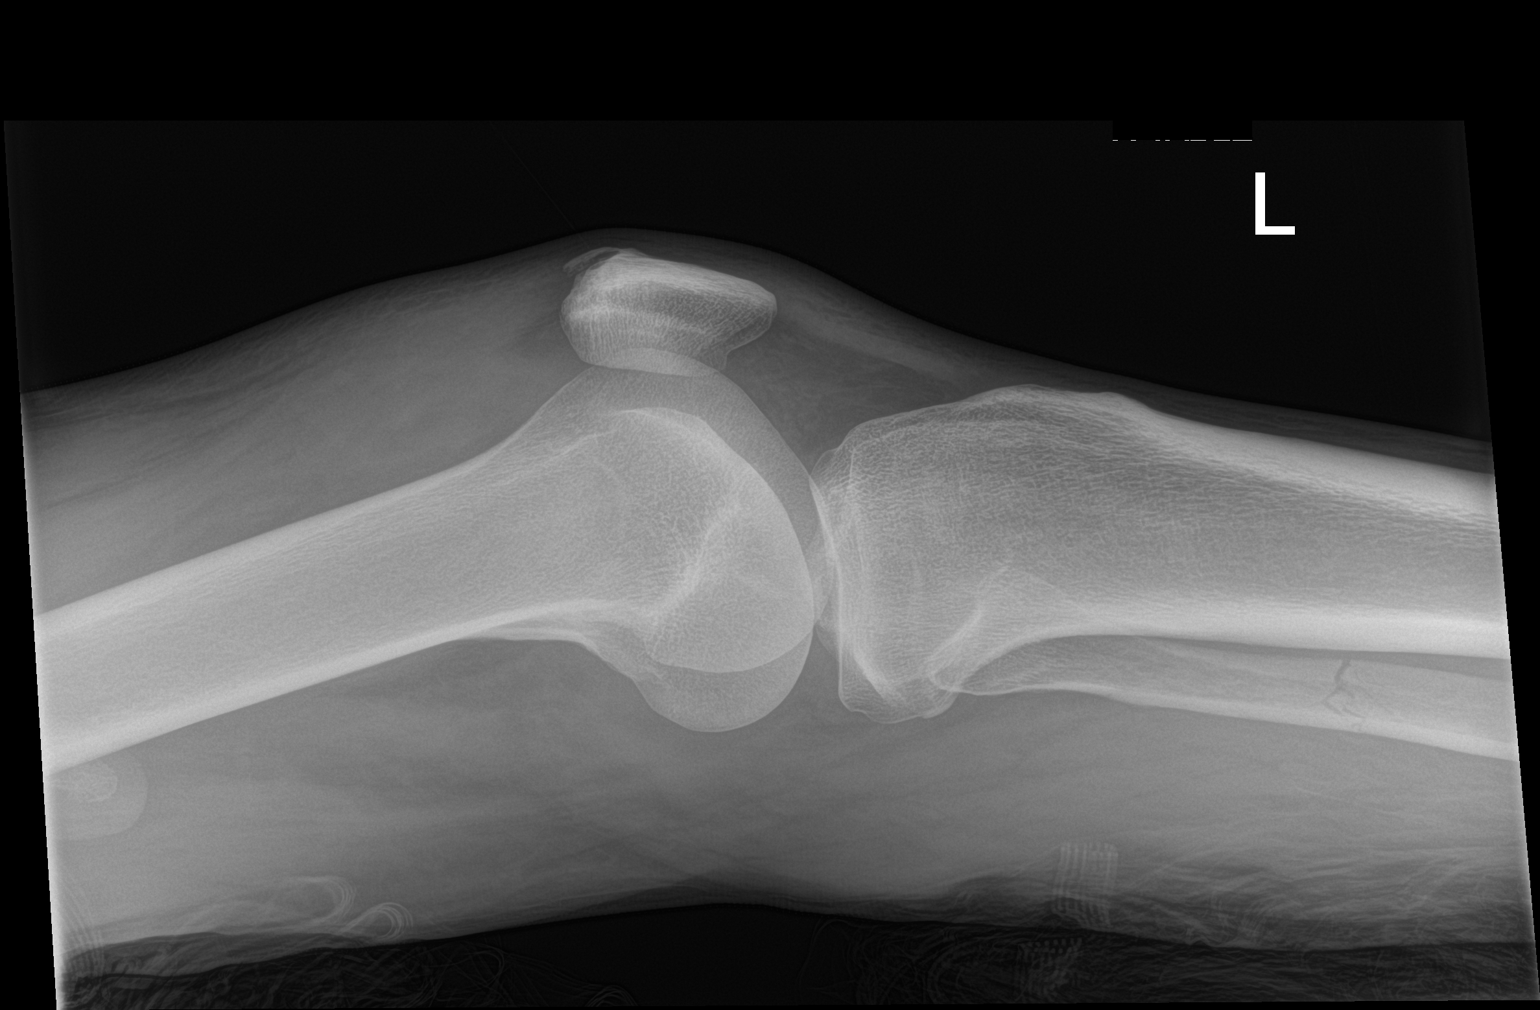

[2 of 2 positions shown; findings below may reference images not displayed]

FINDINGS: Transverse fracture proximal fibular shaft with minimal angulation.
No other fracture noted on this two view exam. Suprapatellar joint
effusion.
IMPRESSION: 1. Transverse fracture proximal fibular shaft with minimal
angulation.
2. Suprapatellar joint effusion.

These results will be called to the ordering clinician or
representative by the Radiologist Assistant, and communication
documented in the PACS or zVision Dashboard.

## 2020-08-08 IMAGING — DX DG TIBIA/FIBULA 2V*L*
4 series · 4 of 4 positions shown · non-contrast
Comparison: 03/21/2018

CLINICAL DATA: Known left fibular fracture.  Evaluate fracture.

EXAM:
LEFT TIBIA AND FIBULA - 2 VIEW

[tibia ap (1 of 2)]
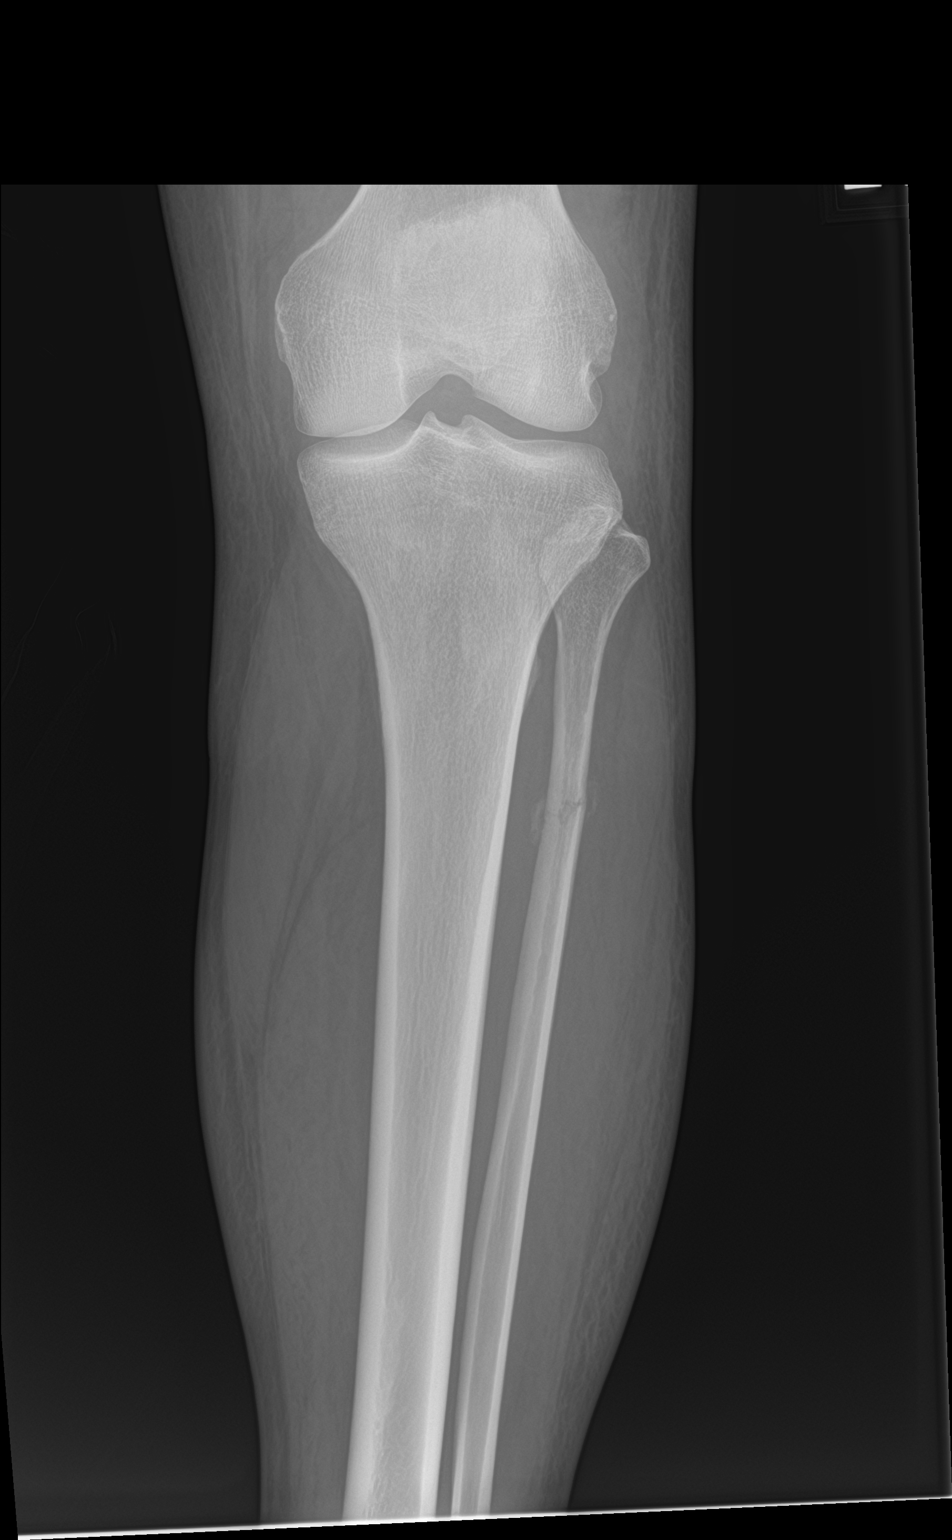

[tibia ap (2 of 2)]
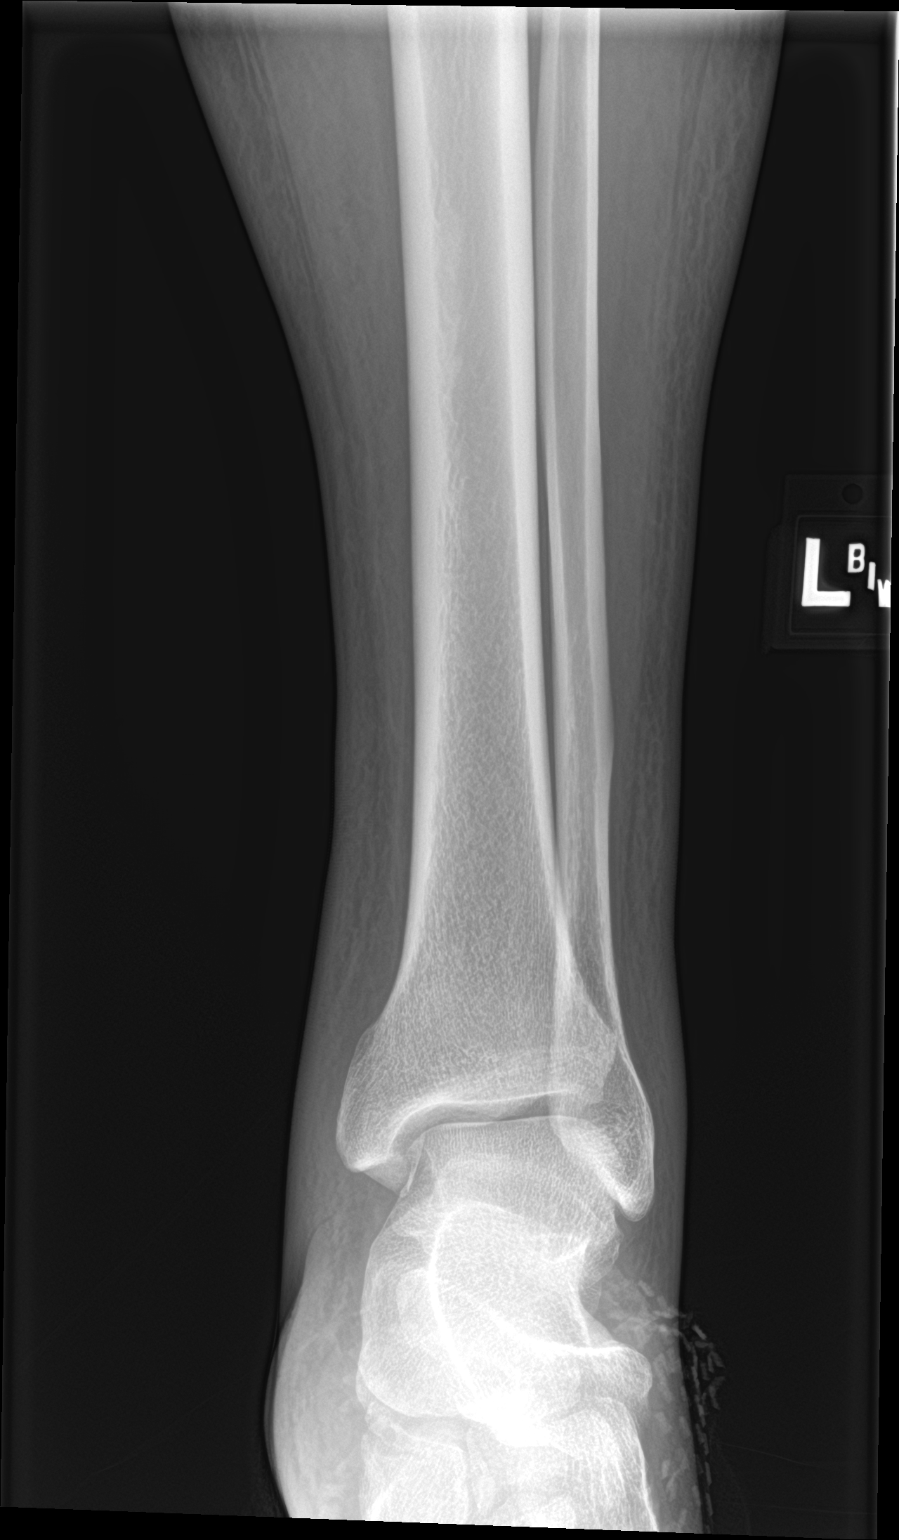

[tibia lat (1 of 2)]
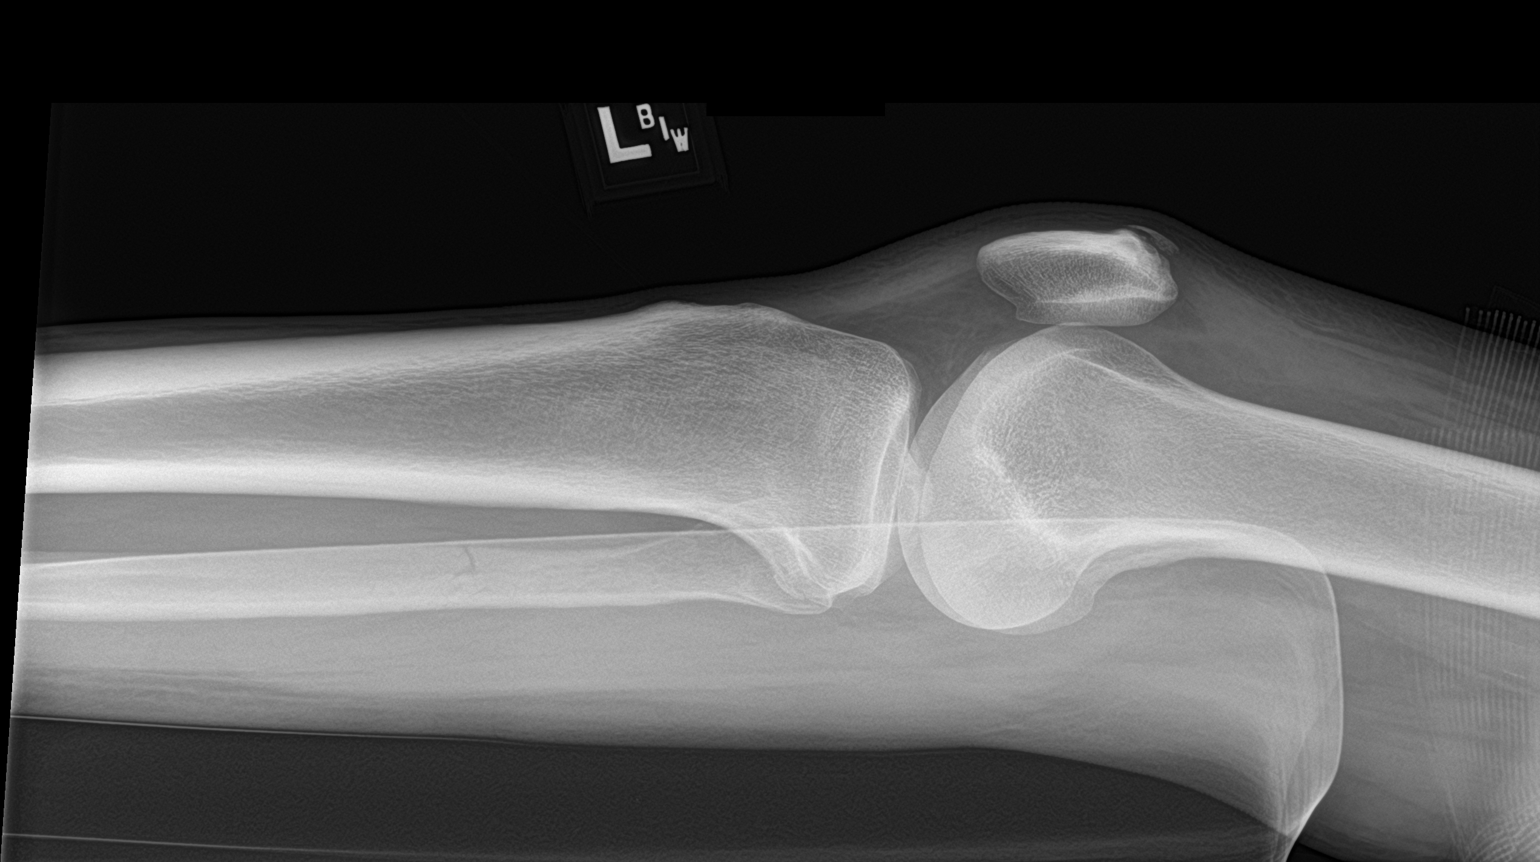

[tibia lat (2 of 2)]
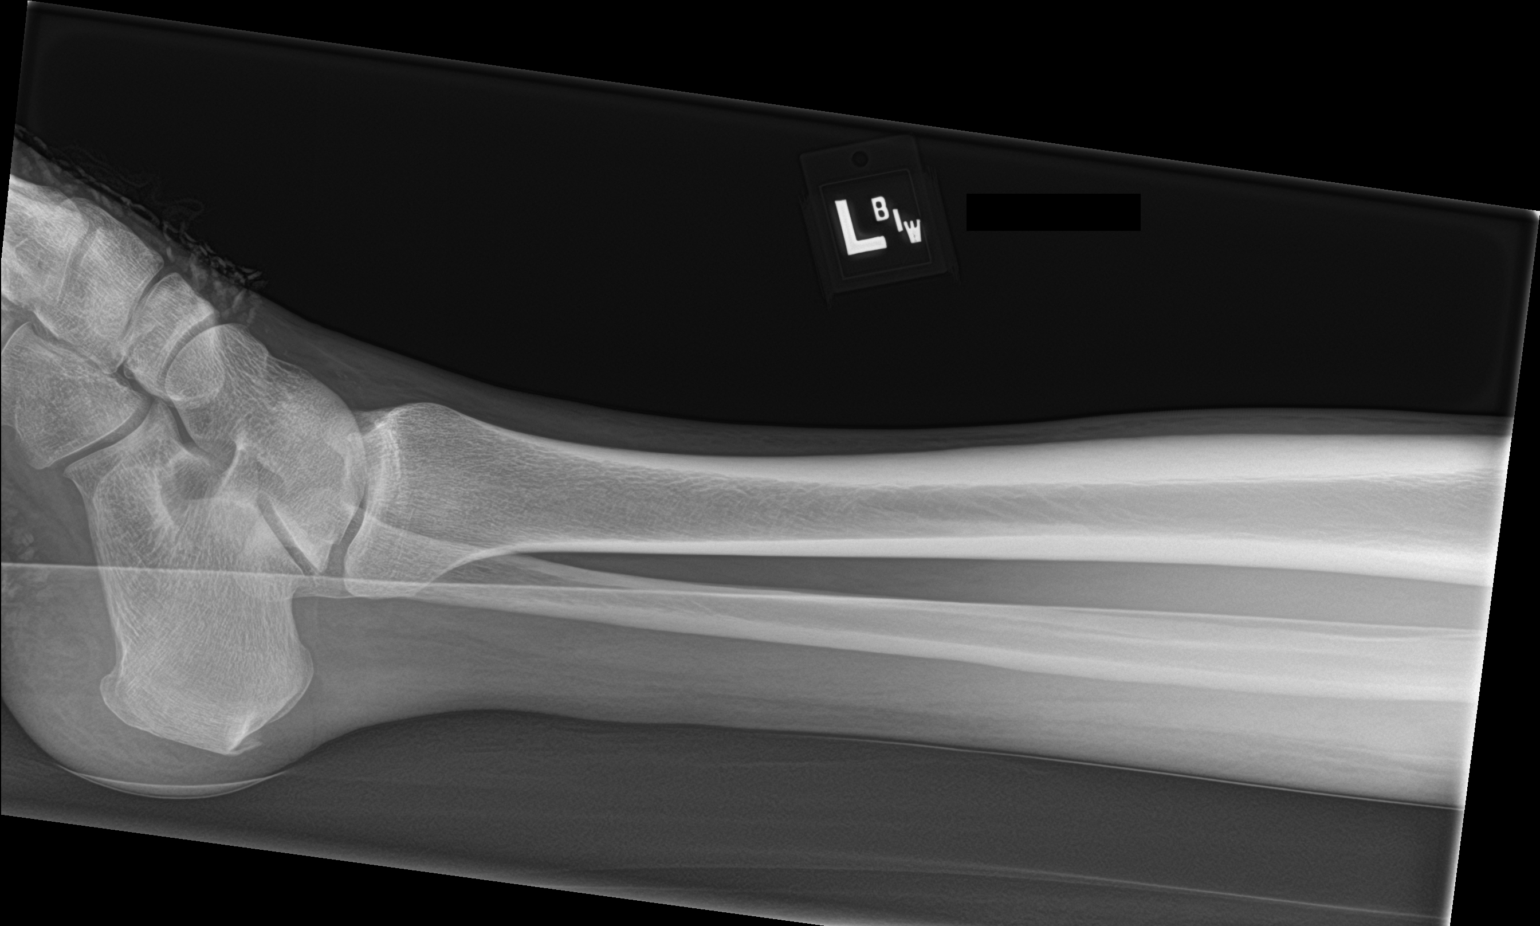

[4 of 4 positions shown; findings below may reference images not displayed]

FINDINGS: Nondisplaced proximal left fibular shaft fracture again noted.
Fracture line remains evident, but there is early callus formation
noted. No tibial abnormality.
IMPRESSION: Left proximal fibular shaft fracture with early evidence of
healing/callus formation.
